# Patient Record
Sex: Male | Born: 1945 | Race: White | Hispanic: No | Marital: Married | Smoking: Never smoker
Health system: Southern US, Community
[De-identification: ages and names within clinical notes are randomized; demographics above are authoritative.]

## PROBLEM LIST (undated history)

## (undated) DIAGNOSIS — K219 Gastro-esophageal reflux disease without esophagitis: Secondary | ICD-10-CM

## (undated) DIAGNOSIS — C170 Malignant neoplasm of duodenum: Secondary | ICD-10-CM

## (undated) DIAGNOSIS — K922 Gastrointestinal hemorrhage, unspecified: Secondary | ICD-10-CM

## (undated) DIAGNOSIS — I10 Essential (primary) hypertension: Secondary | ICD-10-CM

## (undated) DIAGNOSIS — E119 Type 2 diabetes mellitus without complications: Secondary | ICD-10-CM

## (undated) DIAGNOSIS — H3530 Unspecified macular degeneration: Secondary | ICD-10-CM

## (undated) DIAGNOSIS — E785 Hyperlipidemia, unspecified: Secondary | ICD-10-CM

## (undated) DIAGNOSIS — N200 Calculus of kidney: Secondary | ICD-10-CM

## (undated) DIAGNOSIS — G4730 Sleep apnea, unspecified: Secondary | ICD-10-CM

## (undated) DIAGNOSIS — J869 Pyothorax without fistula: Secondary | ICD-10-CM

## (undated) DIAGNOSIS — C229 Malignant neoplasm of liver, not specified as primary or secondary: Secondary | ICD-10-CM

## (undated) DIAGNOSIS — I2699 Other pulmonary embolism without acute cor pulmonale: Secondary | ICD-10-CM

---

## 2002-03-30 ENCOUNTER — Emergency Department (HOSPITAL_COMMUNITY): Admission: EM | Admit: 2002-03-30 | Discharge: 2002-03-30 | Payer: Self-pay | Admitting: Emergency Medicine

## 2004-05-08 ENCOUNTER — Emergency Department (HOSPITAL_COMMUNITY): Admission: EM | Admit: 2004-05-08 | Discharge: 2004-05-08 | Payer: Self-pay | Admitting: Emergency Medicine

## 2004-05-18 ENCOUNTER — Emergency Department (HOSPITAL_COMMUNITY): Admission: EM | Admit: 2004-05-18 | Discharge: 2004-05-18 | Payer: Self-pay | Admitting: Emergency Medicine

## 2004-05-28 ENCOUNTER — Emergency Department (HOSPITAL_COMMUNITY): Admission: EM | Admit: 2004-05-28 | Discharge: 2004-05-28 | Payer: Self-pay | Admitting: Emergency Medicine

## 2010-11-03 ENCOUNTER — Emergency Department (HOSPITAL_COMMUNITY)
Admission: EM | Admit: 2010-11-03 | Discharge: 2010-11-04 | Disposition: A | Discharge status: Home / Self Care | Payer: Worker's Compensation | Attending: Emergency Medicine | Admitting: Emergency Medicine

## 2010-11-03 DIAGNOSIS — Y990 Civilian activity done for income or pay: Secondary | ICD-10-CM | POA: Insufficient documentation

## 2010-11-03 DIAGNOSIS — S0010XA Contusion of unspecified eyelid and periocular area, initial encounter: Secondary | ICD-10-CM | POA: Insufficient documentation

## 2010-11-03 DIAGNOSIS — W2203XA Walked into furniture, initial encounter: Secondary | ICD-10-CM | POA: Insufficient documentation

## 2010-11-03 DIAGNOSIS — Y9269 Other specified industrial and construction area as the place of occurrence of the external cause: Secondary | ICD-10-CM | POA: Insufficient documentation

## 2010-11-12 ENCOUNTER — Emergency Department (HOSPITAL_COMMUNITY)
Admission: EM | Admit: 2010-11-12 | Discharge: 2010-11-12 | Disposition: A | Discharge status: Home / Self Care | Payer: Worker's Compensation | Attending: Emergency Medicine | Admitting: Emergency Medicine

## 2010-11-12 DIAGNOSIS — L0201 Cutaneous abscess of face: Secondary | ICD-10-CM | POA: Insufficient documentation

## 2010-11-12 DIAGNOSIS — L03211 Cellulitis of face: Secondary | ICD-10-CM | POA: Insufficient documentation

## 2012-10-26 ENCOUNTER — Ambulatory Visit (INDEPENDENT_AMBULATORY_CARE_PROVIDER_SITE_OTHER): Payer: Medicare Other | Admitting: Family Medicine

## 2012-10-26 VITALS — BP 132/84 | HR 94 | Temp 97.6°F | Resp 20 | Ht 66.25 in | Wt 189.4 lb

## 2012-10-26 DIAGNOSIS — E119 Type 2 diabetes mellitus without complications: Secondary | ICD-10-CM | POA: Insufficient documentation

## 2012-10-26 DIAGNOSIS — N4889 Other specified disorders of penis: Secondary | ICD-10-CM

## 2012-10-26 DIAGNOSIS — Z8639 Personal history of other endocrine, nutritional and metabolic disease: Secondary | ICD-10-CM

## 2012-10-26 DIAGNOSIS — IMO0001 Reserved for inherently not codable concepts without codable children: Secondary | ICD-10-CM

## 2012-10-26 DIAGNOSIS — Z862 Personal history of diseases of the blood and blood-forming organs and certain disorders involving the immune mechanism: Secondary | ICD-10-CM

## 2012-10-26 DIAGNOSIS — N489 Disorder of penis, unspecified: Secondary | ICD-10-CM

## 2012-10-26 DIAGNOSIS — L723 Sebaceous cyst: Secondary | ICD-10-CM

## 2012-10-26 LAB — POCT WET PREP WITH KOH
KOH Prep POC: POSITIVE
Trichomonas, UA: NEGATIVE

## 2012-10-26 LAB — POCT CBC
Granulocyte percent: 77.1 %G (ref 37–80)
MID (cbc): 0.5 (ref 0–0.9)
MPV: 9.2 fL (ref 0–99.8)
POC Granulocyte: 7.6 — AB (ref 2–6.9)
POC MID %: 5.3 %M (ref 0–12)
Platelet Count, POC: 179 10*3/uL (ref 142–424)
RBC: 4.87 M/uL (ref 4.69–6.13)

## 2012-10-26 LAB — GLUCOSE, POCT (MANUAL RESULT ENTRY): POC Glucose: 305 mg/dl — AB (ref 70–99)

## 2012-10-26 LAB — BASIC METABOLIC PANEL
CO2: 25 mEq/L (ref 19–32)
Calcium: 9.4 mg/dL (ref 8.4–10.5)
Chloride: 98 mEq/L (ref 96–112)
Sodium: 133 mEq/L — ABNORMAL LOW (ref 135–145)

## 2012-10-26 MED ORDER — DOXYCYCLINE HYCLATE 100 MG PO TABS: 100.0000 mg | ORAL_TABLET | Freq: Two times a day (BID) | ORAL | Status: DC

## 2012-10-26 MED ORDER — METFORMIN HCL 500 MG PO TABS: 500.0000 mg | ORAL_TABLET | Freq: Two times a day (BID) | ORAL | Status: DC

## 2012-10-26 MED ORDER — FLUCONAZOLE 150 MG PO TABS: 150.0000 mg | ORAL_TABLET | Freq: Once | ORAL | Status: DC

## 2012-10-26 NOTE — Progress Notes (Addendum)
[facility] [address] [place] [address] [phone]  Date:  10/26/2012   Name:  Manuel Barr   DOB:  [birth year]   MRN:  [id number]  PCP:  No PCP Per Patient    Chief Complaint: Mass   History of Present Illness:  Manuel Barr is a 67 y.o. very pleasant male patient who presents with the following:  He notes a "lump" on the back of his neck- first noted a week ago.  It was sore- this is now resolved, but it seems to be getting larger.  No drainage yet.    6- 8 months ago he was diagnosed with an "ingrown hair" in the groin area. He was treated with abx and recovered.  He has this sort of problem again since about 5 days ago.  It is sore but not terrible.  No drainage.  Subjective "hot and cold" at home but no noted fever.   He is able to urinate, and can retract his foreskin without difficulty.    He has a history of DM,but does not take any medication for this.  He has been on several meds, but stopped taking them due to concern about possible adverse effects.  He has been on insulin and metformin in the past.  No current PCP  Last rx doxy last summer per his drug store- he is not sure if this was rx for his "ingrown hair."    There are no active problems to display for this patient.   Past Medical History  Diagnosis Date  . Diabetes mellitus without complication     History reviewed. No pertinent past surgical history.  History  Substance Use Topics  . Smoking status: Never Smoker   . Smokeless tobacco: Not on file  . Alcohol Use: No    Family History  Problem Relation Age of Onset  . Diabetes Mother   . Diabetes Father   . Heart disease Father   . Emphysema Father     Allergies  Allergen Reactions  . Codeine     Medication list has been reviewed and updated.  No current outpatient prescriptions on file prior to visit.   No current facility-administered medications on file prior to visit.    Review of Systems:  As  per HPI- otherwise negative.   Physical Examination: Filed Vitals:   10/26/12 1205  BP: 132/84  Pulse: 94  Temp: 97.6 F (36.4 C)  Resp: 20   Filed Vitals:   10/26/12 1205  Height: 5' 6.25" (1.683 m)  Weight: 189 lb 6.4 oz (85.911 kg)   Body mass index is 30.33 kg/(m^2). Ideal Body Weight: Weight in (lb) to have BMI = 25: 155.7  GEN: WDWN, NAD, Non-toxic, A & O x 3 HEENT: Atraumatic, Normocephalic. Neck supple. No masses, No LAD.  Bilateral TM wnl, oropharynx normal.  PEERL,EOMI.   On the back of his neck is a small sebaceous cyst.  Squeezed gently and removed material.  Does not appear acutely infected  Ears and Nose: No external deformity. CV: RRR, No M/G/R. No JVD. No thrill. No extra heart sounds. PULM: CTA B, no wheezes, crackles, rhonchi. No retractions. No resp. distress. No accessory muscle use. ABD: S, NT, ND, +BS. No rebound. No HSM. EXTR: No c/c/e NEURO Normal gait.  PSYCH: Normally interactive. Conversant. Not depressed or anxious appearing.  Calm demeanor.  GU: he has a red rash suggestive of yeast in his inguinal folds bilaterally.  The foreskin  is red, somewhat swollen and there is discharge from the skin.  I could not easily retract foreskin, but per pt he is able to do this without difficulty.    Results for orders placed in visit on 10/26/12  POCT CBC      Result Value Range   WBC 9.8  4.6 - 10.2 K/uL   Lymph, poc 1.7  0.6 - 3.4   POC LYMPH PERCENT 14.6  10 - 50 %L   MID (cbc) 0.5  0 - 0.9   POC MID % 5.3  0 - 12 %M   POC Granulocyte 7.6 (*) 2 - 6.9   Granulocyte percent 77.1  37 - 80 %G   RBC 4.87  4.69 - 6.13 M/uL   Hemoglobin 14.4  14.1 - 18.1 g/dL   HCT, POC 29.5  62.1 - 53.7 %   MCV 91.8  80 - 97 fL   MCH, POC 29.6  27 - 31.2 pg   MCHC 32.2  31.8 - 35.4 g/dL   RDW, POC 30.8     Platelet Count, POC 179  142 - 424 K/uL   MPV 9.2  0 - 99.8 fL  POCT GLYCOSYLATED HEMOGLOBIN (HGB A1C)      Result Value Range   Hemoglobin A1C >14.0    GLUCOSE,  POCT (MANUAL RESULT ENTRY)      Result Value Range   POC Glucose 305 (*) 70 - 99 mg/dl  POCT WET PREP WITH KOH      Result Value Range   Trichomonas, UA Negative     Clue Cells Wet Prep HPF POC neg     Epithelial Wet Prep HPF POC 0-4     Yeast Wet Prep HPF POC neg     Bacteria Wet Prep HPF POC 1+     RBC Wet Prep HPF POC neg     WBC Wet Prep HPF POC 0-7     KOH Prep POC Positive       Assessment and Plan:  Penile pain - Plan: POCT CBC, POCT Wet Prep with KOH, doxycycline (VIBRA-TABS) 100 MG tablet, fluconazole (DIFLUCAN) 150 MG tablet  Sebaceous cyst - Plan: POCT CBC, doxycycline (VIBRA-TABS) 100 MG tablet  History of diabetes mellitus - Plan: POCT glycosylated hemoglobin (Hb A1C), POCT glucose (manual entry), Basic metabolic panel, metFORMIN (GLUCOPHAGE) 500 MG tablet  Type II or unspecified type diabetes mellitus without mention of complication, uncontrolled   Uncontrolled DM- this is longstanding. Will start on metformin.  Await labs. Suspect he will need insulin.  However, [facility] which will allow him to get insulin more cheaply  Sebaceous cyst. Declines I and D at this time.  Hot compresses, doxycycline Groin infection: suspect yeast and bacterial infection.  Doxycycline, diflucan.  Plan recheck tomorrow to check his progress.  If any worsening, not able to urinate, etc he will go to the ER or call me.    Signed Abbe Amsterdam, MD  addnd 8/19- received his BMP.      Chemistry    Results for orders placed in visit on 10/26/12  BASIC METABOLIC PANEL      Result Value Range   Sodium 133 (*) 135 - 145 mEq/L   Potassium 3.8  3.5 - 5.3 mEq/L   Chloride 98  96 - 112 mEq/L   CO2 25  19 - 32 mEq/L   Glucose, Bld 293 (*) 70 - 99 mg/dL   BUN 15  6 -  23 mg/dL   Creat 9.14  7.82 - 9.56 mg/dL   Calcium 9.4  8.4 - 21.3 mg/dL  POCT CBC      Result Value Range   WBC 9.8  4.6 - 10.2 K/uL   Lymph, poc 1.7  0.6 - 3.4   POC LYMPH  PERCENT 14.6  10 - 50 %L   MID (cbc) 0.5  0 - 0.9   POC MID % 5.3  0 - 12 %M   POC Granulocyte 7.6 (*) 2 - 6.9   Granulocyte percent 77.1  37 - 80 %G   RBC 4.87  4.69 - 6.13 M/uL   Hemoglobin 14.4  14.1 - 18.1 g/dL   HCT, POC 08.6  57.8 - 53.7 %   MCV 91.8  80 - 97 fL   MCH, POC 29.6  27 - 31.2 pg   MCHC 32.2  31.8 - 35.4 g/dL   RDW, POC 46.9     Platelet Count, POC 179  142 - 424 K/uL   MPV 9.2  0 - 99.8 fL  POCT GLYCOSYLATED HEMOGLOBIN (HGB A1C)      Result Value Range   Hemoglobin A1C >14.0    GLUCOSE, POCT (MANUAL RESULT ENTRY)      Result Value Range   POC Glucose 305 (*) 70 - 99 mg/dl  POCT WET PREP WITH KOH      Result Value Range   Trichomonas, UA Negative     Clue Cells Wet Prep HPF POC neg     Epithelial Wet Prep HPF POC 0-4     Yeast Wet Prep HPF POC neg     Bacteria Wet Prep HPF POC 1+     RBC Wet Prep HPF POC neg     WBC Wet Prep HPF POC 0-7     KOH Prep POC Positive      Corrected sodium = 136  Anion gap = 10, ok. Await recheck today

## 2012-10-26 NOTE — Patient Instructions (Addendum)
Start back on metformin for your diabetes.  Use the doxycycline antibiotic and diflucan (yeast medication) as directed.  Also buy an OTC yeast cream such as lotromin and apply to your groin  Please come and see me tomorrow for a recheck  PLEASE FAST TRACK FOR A RECHECK 8/19- [place]

## 2012-10-27 ENCOUNTER — Encounter: Payer: Self-pay | Admitting: Family Medicine

## 2013-12-02 ENCOUNTER — Telehealth: Payer: Self-pay | Admitting: *Deleted

## 2013-12-02 NOTE — Telephone Encounter (Signed)
Called left message with male to have patient call the office to follow up diabetes.

## 2014-08-10 HISTORY — PX: ABDOMINAL EXPLORATION SURGERY: SHX538

## 2014-08-10 HISTORY — PX: CHOLECYSTECTOMY OPEN: SUR202

## 2014-09-09 HISTORY — PX: BILE DUCT STENT PLACEMENT: SHX1227

## 2014-11-07 ENCOUNTER — Encounter (HOSPITAL_COMMUNITY): Payer: Self-pay | Admitting: *Deleted

## 2014-11-07 ENCOUNTER — Emergency Department (HOSPITAL_COMMUNITY): Payer: Medicare Other

## 2014-11-07 ENCOUNTER — Observation Stay (HOSPITAL_COMMUNITY)
Admission: EM | Admit: 2014-11-07 | Discharge: 2014-11-08 | Disposition: A | Discharge status: Home / Self Care | Payer: Medicare Other | Attending: Internal Medicine | Admitting: Internal Medicine

## 2014-11-07 DIAGNOSIS — R4701 Aphasia: Secondary | ICD-10-CM | POA: Insufficient documentation

## 2014-11-07 DIAGNOSIS — R471 Dysarthria and anarthria: Secondary | ICD-10-CM | POA: Insufficient documentation

## 2014-11-07 DIAGNOSIS — R479 Unspecified speech disturbances: Secondary | ICD-10-CM | POA: Diagnosis not present

## 2014-11-07 DIAGNOSIS — Z6830 Body mass index (BMI) 30.0-30.9, adult: Secondary | ICD-10-CM | POA: Diagnosis not present

## 2014-11-07 DIAGNOSIS — E785 Hyperlipidemia, unspecified: Secondary | ICD-10-CM | POA: Insufficient documentation

## 2014-11-07 DIAGNOSIS — C787 Secondary malignant neoplasm of liver and intrahepatic bile duct: Secondary | ICD-10-CM | POA: Insufficient documentation

## 2014-11-07 DIAGNOSIS — F809 Developmental disorder of speech and language, unspecified: Secondary | ICD-10-CM | POA: Diagnosis not present

## 2014-11-07 DIAGNOSIS — C170 Malignant neoplasm of duodenum: Secondary | ICD-10-CM | POA: Insufficient documentation

## 2014-11-07 DIAGNOSIS — I6523 Occlusion and stenosis of bilateral carotid arteries: Secondary | ICD-10-CM | POA: Insufficient documentation

## 2014-11-07 DIAGNOSIS — G459 Transient cerebral ischemic attack, unspecified: Secondary | ICD-10-CM

## 2014-11-07 DIAGNOSIS — F419 Anxiety disorder, unspecified: Secondary | ICD-10-CM | POA: Insufficient documentation

## 2014-11-07 DIAGNOSIS — I10 Essential (primary) hypertension: Secondary | ICD-10-CM | POA: Insufficient documentation

## 2014-11-07 DIAGNOSIS — R410 Disorientation, unspecified: Secondary | ICD-10-CM | POA: Insufficient documentation

## 2014-11-07 DIAGNOSIS — E069 Thyroiditis, unspecified: Secondary | ICD-10-CM | POA: Insufficient documentation

## 2014-11-07 DIAGNOSIS — R4781 Slurred speech: Secondary | ICD-10-CM | POA: Insufficient documentation

## 2014-11-07 DIAGNOSIS — Z79899 Other long term (current) drug therapy: Secondary | ICD-10-CM | POA: Diagnosis not present

## 2014-11-07 DIAGNOSIS — E11649 Type 2 diabetes mellitus with hypoglycemia without coma: Secondary | ICD-10-CM | POA: Diagnosis not present

## 2014-11-07 DIAGNOSIS — K5900 Constipation, unspecified: Secondary | ICD-10-CM | POA: Diagnosis not present

## 2014-11-07 DIAGNOSIS — E119 Type 2 diabetes mellitus without complications: Secondary | ICD-10-CM | POA: Diagnosis not present

## 2014-11-07 HISTORY — DX: Essential (primary) hypertension: I10

## 2014-11-07 HISTORY — DX: Unspecified macular degeneration: H35.30

## 2014-11-07 HISTORY — DX: Hyperlipidemia, unspecified: E78.5

## 2014-11-07 HISTORY — DX: Malignant neoplasm of liver, not specified as primary or secondary: C22.9

## 2014-11-07 HISTORY — DX: Type 2 diabetes mellitus without complications: E11.9

## 2014-11-07 HISTORY — DX: Malignant neoplasm of duodenum: C17.0

## 2014-11-07 HISTORY — DX: Sleep apnea, unspecified: G47.30

## 2014-11-07 HISTORY — DX: Calculus of kidney: N20.0

## 2014-11-07 HISTORY — DX: Gastro-esophageal reflux disease without esophagitis: K21.9

## 2014-11-07 LAB — I-STAT CHEM 8, ED
BUN: 13 mg/dL (ref 6–20)
CREATININE: 1 mg/dL (ref 0.61–1.24)
Calcium, Ion: 1.06 mmol/L — ABNORMAL LOW (ref 1.13–1.30)
Chloride: 104 mmol/L (ref 101–111)
Glucose, Bld: 86 mg/dL (ref 65–99)
HEMATOCRIT: 33 % — AB (ref 39.0–52.0)
HEMOGLOBIN: 11.2 g/dL — AB (ref 13.0–17.0)
POTASSIUM: 3.1 mmol/L — AB (ref 3.5–5.1)
Sodium: 142 mmol/L (ref 135–145)
TCO2: 22 mmol/L (ref 0–100)

## 2014-11-07 LAB — COMPREHENSIVE METABOLIC PANEL
ALBUMIN: 3 g/dL — AB (ref 3.5–5.0)
ALT: 25 U/L (ref 17–63)
AST: 35 U/L (ref 15–41)
Alkaline Phosphatase: 128 U/L — ABNORMAL HIGH (ref 38–126)
Anion gap: 11 (ref 5–15)
BUN: 12 mg/dL (ref 6–20)
CHLORIDE: 108 mmol/L (ref 101–111)
CO2: 24 mmol/L (ref 22–32)
CREATININE: 1.21 mg/dL (ref 0.61–1.24)
Calcium: 8.8 mg/dL — ABNORMAL LOW (ref 8.9–10.3)
GFR calc Af Amer: >60 mL/min (ref >60)
GFR calc non Af Amer: >60 mL/min (ref >60)
Glucose, Bld: 91 mg/dL (ref 65–99)
Potassium: 3.2 mmol/L — ABNORMAL LOW (ref 3.5–5.1)
SODIUM: 143 mmol/L (ref 135–145)
Total Bilirubin: 1.3 mg/dL — ABNORMAL HIGH (ref 0.3–1.2)
Total Protein: 6.2 g/dL — ABNORMAL LOW (ref 6.5–8.1)

## 2014-11-07 LAB — DIFFERENTIAL
BASOS PCT: 0 % (ref 0–1)
Basophils Absolute: 0 10*3/uL (ref 0.0–0.1)
Eosinophils Absolute: 0.1 10*3/uL (ref 0.0–0.7)
Eosinophils Relative: 2 % (ref 0–5)
Lymphocytes Relative: 40 % (ref 12–46)
Lymphs Abs: 2 10*3/uL (ref 0.7–4.0)
Monocytes Absolute: 0.5 10*3/uL (ref 0.1–1.0)
Monocytes Relative: 10 % (ref 3–12)
NEUTROS ABS: 2.4 10*3/uL (ref 1.7–7.7)
Neutrophils Relative %: 48 % (ref 43–77)

## 2014-11-07 LAB — CBC
HCT: 32 % — ABNORMAL LOW (ref 39.0–52.0)
Hemoglobin: 10.7 g/dL — ABNORMAL LOW (ref 13.0–17.0)
MCH: 28.2 pg (ref 26.0–34.0)
MCHC: 33.4 g/dL (ref 30.0–36.0)
MCV: 84.4 fL (ref 78.0–100.0)
PLATELETS: 176 10*3/uL (ref 150–400)
RBC: 3.79 MIL/uL — ABNORMAL LOW (ref 4.22–5.81)
RDW: 15.6 % — AB (ref 11.5–15.5)
WBC: 5.1 10*3/uL (ref 4.0–10.5)

## 2014-11-07 LAB — PROTIME-INR
INR: 1.1 (ref 0.00–1.49)
Prothrombin Time: 14.4 seconds (ref 11.6–15.2)

## 2014-11-07 LAB — I-STAT TROPONIN, ED: Troponin i, poc: 0.02 ng/mL (ref 0.00–0.08)

## 2014-11-07 LAB — APTT: APTT: 27 s (ref 24–37)

## 2014-11-07 LAB — CBG MONITORING, ED
GLUCOSE-CAPILLARY: 148 mg/dL — AB (ref 65–99)
Glucose-Capillary: 96 mg/dL (ref 65–99)

## 2014-11-07 MED ORDER — ASPIRIN EC 81 MG PO TBEC
81.0000 mg | DELAYED_RELEASE_TABLET | Freq: Every day | ORAL | Status: DC
  Administered 2014-11-08: 81 mg via ORAL
  Filled 2014-11-07: qty 1

## 2014-11-07 MED ORDER — ONDANSETRON HCL 4 MG PO TABS: 4.0000 mg | ORAL_TABLET | Freq: Four times a day (QID) | ORAL | Status: DC | PRN

## 2014-11-07 MED ORDER — POLYETHYLENE GLYCOL 3350 17 G PO PACK
17.0000 g | PACK | Freq: Two times a day (BID) | ORAL | Status: DC
  Administered 2014-11-08: 17 g via ORAL
  Filled 2014-11-07 (×2): qty 1

## 2014-11-07 MED ORDER — PANTOPRAZOLE SODIUM 40 MG PO TBEC
40.0000 mg | DELAYED_RELEASE_TABLET | Freq: Every day | ORAL | Status: DC
  Administered 2014-11-08: 40 mg via ORAL
  Filled 2014-11-07: qty 1

## 2014-11-07 MED ORDER — ENOXAPARIN SODIUM 40 MG/0.4ML ~~LOC~~ SOLN
40.0000 mg | SUBCUTANEOUS | Status: DC
  Administered 2014-11-07: 40 mg via SUBCUTANEOUS
  Filled 2014-11-07: qty 0.4

## 2014-11-07 MED ORDER — SODIUM CHLORIDE 0.9 % IJ SOLN
3.0000 mL | Freq: Two times a day (BID) | INTRAMUSCULAR | Status: DC
  Administered 2014-11-07: 3 mL via INTRAVENOUS

## 2014-11-07 MED ORDER — ONDANSETRON HCL 4 MG/2ML IJ SOLN: 4.0000 mg | Freq: Four times a day (QID) | INTRAMUSCULAR | Status: DC | PRN

## 2014-11-07 MED ORDER — DEXTROSE 50 % IV SOLN
25.0000 mL | Freq: Once | INTRAVENOUS | Status: AC
  Administered 2014-11-07: 25 mL via INTRAVENOUS

## 2014-11-07 MED ORDER — PRAVASTATIN SODIUM 40 MG PO TABS
40.0000 mg | ORAL_TABLET | Freq: Every day | ORAL | Status: DC
  Administered 2014-11-07: 40 mg via ORAL
  Filled 2014-11-07: qty 1

## 2014-11-07 MED ORDER — POTASSIUM CHLORIDE IN NACL 40-0.9 MEQ/L-% IV SOLN
INTRAVENOUS | Status: AC
  Administered 2014-11-07 – 2014-11-08 (×2): 125 mL/h via INTRAVENOUS
  Filled 2014-11-07 (×3): qty 1000

## 2014-11-07 MED ORDER — DEXTROSE 50 % IV SOLN
INTRAVENOUS | Status: AC
  Filled 2014-11-07: qty 50

## 2014-11-07 NOTE — Code Documentation (Addendum)
69 year old presents to ED as code stroke via [facility].  Wife states he was watching TV all was fine - he went to bedroom to take a nap - he immediately called out from the bedroom for her to help him make a phone call - he was unable to get his words out and seemed confused.  On presentation he is alert - cool and dry- pale - moves all extremities with good strength -unable to follow commands for the most - will occassionaly follow simple one step command - speech is word salad - does have some social speech -"ouch that hurts'  " I am fine" then will have speech with no comprehensible sounds.  CBG 96.  NIHHS 7 - speech related.  Has recent (June) hx of abd surgery - attempted whipple per wife -but unable to resect - so mostly exp lap - did have CBD stent placed.  Has new dx duodenal cancer with liver mets -had  recent first chemo treatment at [place] in [place].  Stat DWI done - no acute stroke per MD.  Wife reports and recent hospital stay at [facility] in [facility] shows patient with normal blood sugar range in mid 200's to 300 range.  Question if 8 is hypoglycemic for this patient - wife states she has not seen it that low - reports he has not been eating as well since cancer dx - 1/2 amp D50W given with some resolution of speech - recognizing wife with correct naming of some objects.  No acute stroke treatment per Dr. Erlinda Hong.  Handoff to [facility] RN.

## 2014-11-07 NOTE — ED Notes (Signed)
Pt taken to MRI with this RN, rapid response RN and Dr Erlinda Hong on monitor. Pt tolerated well.

## 2014-11-07 NOTE — Consult Note (Signed)
Referring Physician: code stroke    Chief Complaint: sudden onset language deficit  HPI: Manuel Barr is an 69 y.o. male with hx of HTN, DM, HLD, duodenal cancer with liver metastasis s/p bile duct drainage one month ago presented with acute onset language difficulty. Pt was watching TV with wife and appeared to be normal including speech. He went to room and calling his friend, but seems to have confusion with speed dial and then got frustrated and agitated, yelling and speech with word salad and pressure speech. EMS called and on arrival, seems pt has some dizziness and blurry vision as per report. BP 138/86 and glucose 99. No weakness, numbness or facial droop, no HA. On arrival, pt had pressured speech and did not answer questions appropriately, intermittently follows commands. However, with painful stimulation, he was able to come up with completely sentences which were also appropriate. Due to the inconsistency, limited MRI done and did not show acute stroke. Pt is going to be admitted for further management.  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 Surgery aborted and he was treated with chemo and radiation. One months ago, he received bile duct drainage procedure.   As per note from EMS, pt also has HTN, DM and HLD on lantus, metformin, pravastatin and lisinopril.   LSN: 16:45 11/07/14 tPA Given: No: presentation was not consistent with stroke  Past Medical History  Diagnosis Date  . Diabetes mellitus without complication     History reviewed. No pertinent past surgical history.  Family History  Problem Relation Age of Onset  . Diabetes Mother   . Diabetes Father   . Heart disease Father   . Emphysema Father    Social History:  reports that he has never smoked. He does not have any smokeless tobacco history on file.  He reports that he does not drink alcohol or use illicit drugs.  Allergies:  Allergies  Allergen Reactions  . Codeine     Medications: No current facility-administered medications on file prior to encounter.   Current Outpatient Prescriptions on File Prior to Encounter  Medication Sig Dispense Refill  . doxycycline (VIBRA-TABS) 100 MG tablet Take 1 tablet (100 mg total) by mouth 2 (two) times daily. 20 tablet 0  . fluconazole (DIFLUCAN) 150 MG tablet Take 1 tablet (150 mg total) by mouth once. Repeat in 3 days 2 tablet 0  . metFORMIN (GLUCOPHAGE) 500 MG tablet Take 1 tablet (500 mg total) by mouth 2 (two) times daily with a meal. Increase as directed. 120 tablet 3   ROS: Review of 14 systems performed with wife as pt is not able to provide further information. ROS negative except mentioned in the HPI.    Physical Examination:  Temp:  [98.6 F (37 C)] 98.6 F (37 C) (08/29 1808) Pulse Rate:  [90-95] 95 (08/29 1900) Resp:  [17-24] 17 (08/29 1900) BP: (136-153)/(84-108) 136/88 mmHg (08/29 1900) SpO2:  [99 %-100 %] 100 % (08/29 1900) Weight:  [186 lb (84.369 kg)] 186 lb (84.369 kg) (08/29 1757)  General - Well nourished, well developed, anxious with mild agitation.  Ophthalmologic - Fundi not visualized due to noncooperation.  Cardiovascular - Regular rate and rhythm with no murmur.  Mental Status -  Awake alert, pressured speech, word salad, however, able to get complete and appropriate sentences intermittently especially during pain stimulation.  Did not answer appropriately with orientation questions. Did not answer appropriately with naming or repetition. Intermittently follows simple commands.  Cranial Nerves II - XII - II - Visual field intact OU. III, IV, VI - Extraocular movements intact. V - Facial sensation intact bilaterally. VII - Facial movement intact bilaterally. VIII - Hearing & vestibular intact bilaterally. X - Palate elevates symmetrically. XI - Chin  turning & shoulder shrug intact bilaterally. XII - Tongue protrusion intact.  Motor Strength - The patient's strength was symmetrical in all extremities and pronator drift was absent.  Bulk was normal and fasciculations were absent.   Motor Tone - Muscle tone was assessed at the neck and appendages and was normal.  Reflexes - The patient's reflexes were 1+ in all extremities and he had no pathological reflexes.  Sensory - Light touch, temperature/pinprick were assessed with equal withdraw bilaterally to pain stimulation.    Coordination - not cooperative on exam.  Tremor was absent.  Gait and Station - The patient's transfers, posture, gait, station, and turns were observed as normal.   Results for orders placed or performed during the hospital encounter of 11/07/14 (from the past 48 hour(s))  Protime-INR     Status: None   Collection Time: 11/07/14  5:40 PM  Result Value Ref Range   Prothrombin Time 14.4 11.6 - 15.2 seconds   INR 1.10 0.00 - 1.49  APTT     Status: None   Collection Time: 11/07/14  5:40 PM  Result Value Ref Range   aPTT 27 24 - 37 seconds  CBC     Status: Abnormal   Collection Time: 11/07/14  5:40 PM  Result Value Ref Range   WBC 5.1 4.0 - 10.5 K/uL   RBC 3.79 (L) 4.22 - 5.81 MIL/uL   Hemoglobin 10.7 (L) 13.0 - 17.0 g/dL   HCT 32.0 (L) 39.0 - 52.0 %   MCV 84.4 78.0 - 100.0 fL   MCH 28.2 26.0 - 34.0 pg   MCHC 33.4 30.0 - 36.0 g/dL   RDW 15.6 (H) 11.5 - 15.5 %   Platelets 176 150 - 400 K/uL  Differential     Status: None   Collection Time: 11/07/14  5:40 PM  Result Value Ref Range   Neutrophils Relative % 48 43 - 77 %   Neutro Abs 2.4 1.7 - 7.7 K/uL   Lymphocytes Relative 40 12 - 46 %   Lymphs Abs 2.0 0.7 - 4.0 K/uL   Monocytes Relative 10 3 - 12 %   Monocytes Absolute 0.5 0.1 - 1.0 K/uL   Eosinophils Relative 2 0 - 5 %   Eosinophils Absolute 0.1 0.0 - 0.7 K/uL   Basophils Relative 0 0 - 1 %   Basophils Absolute 0.0 0.0 - 0.1 K/uL  I-stat troponin, ED  (not at [facility], [facility])     Status: None   Collection Time: 11/07/14  5:45 PM  Result Value Ref Range   Troponin i, poc 0.02 0.00 - 0.08 ng/mL   Comment 3            Comment: Due to the release kinetics of cTnI, a negative result within the first hours of the onset of symptoms does not rule out myocardial infarction with certainty. If myocardial infarction is still suspected, repeat the test at appropriate intervals.   I-Stat Chem 8, ED  (not at [facility], [facility])     Status: Abnormal   Collection Time: 11/07/14  5:47 PM  Result Value Ref Range  Sodium 142 135 - 145 mmol/L   Potassium 3.1 (L) 3.5 - 5.1 mmol/L   Chloride 104 101 - 111 mmol/L   BUN 13 6 - 20 mg/dL   Creatinine, Ser 1.00 0.61 - 1.24 mg/dL   Glucose, Bld 86 65 - 99 mg/dL   Calcium, Ion 1.06 (L) 1.13 - 1.30 mmol/L   TCO2 22 0 - 100 mmol/L   Hemoglobin 11.2 (L) 13.0 - 17.0 g/dL   HCT 33.0 (L) 39.0 - 52.0 %  CBG monitoring, ED     Status: None   Collection Time: 11/07/14  6:03 PM  Result Value Ref Range   Glucose-Capillary 96 65 - 99 mg/dL   Ct Head Wo Contrast  11/07/2014   IMPRESSION: No acute intracranial finding. Chronic cortical volume loss as above.    MRI brain limited DWI - no acute infarction noted as per radiology report via phone.   Assessment: 69 y.o. male with hx of HTN, DM, HLD, duodenal cancer with liver metastasis s/p bile duct drainage one month ago presented with acute onset language difficulty. However, examination showed medically pressured speech, word salad, near appropriate answers with intermittently complete and appropriate sentences pressure on pain stimulation. Due to inconsistent exam, limited MRI did not I was performed which did not show acute infarction. Patient symptoms could be due to panic attack, anxiety, vs. hypoglycemia (baseline 200s, on presentation 90s). Was given 25 mL of D50. Recommend to admit for further evaluation.  Stroke Risk Factors - diabetes mellitus, hyperlipidemia, hypertension  and matastasis cancer  Plan: 1. HgbA1c, fasting lipid panel 2. MRI, MRA were completed and pending formal reports 3. PT consult, OT consult, Speech consult 4. Echocardiogram 5. Carotid dopplers 6. Prophylactic therapy-Antiplatelet med: Aspirin - dose 81mg  7. Risk factor modification 8. Telemetry monitoring 9. Frequent neuro checks 10. Continue other home meds including statin.  I spent 60 minutes of neurocritical care time in the care of this patient.  Rosalin Hawking, MD PhD Stroke Neurology 11/07/2014 7:24 PM

## 2014-11-07 NOTE — H&P (Signed)
Date: 11/08/2014               Patient Name:  Manuel Barr MRN: [id number]  DOB: [birth year] Age / Sex: 69 y.o., male   PCP: Shiela Mayer, PA         Medical Service: Internal Medicine Teaching Service         Attending Physician: Dr. Bartholomew Crews, MD    First Contact: Dr. Liberty Handy Pager: [phone]  Second Contact: Dr. Duwaine Maxin Pager: [phone]       After Hours (After 5p/  First Contact Pager: [phone]  weekends / holidays): Second Contact Pager: [phone]   Chief Complaint: sudden onset of confusion, difficulty with speech  History of Present Illness: Mr. Manuel Barr is a 69 y.o. 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  Symptom onset was this afternoon around 16:45 when patient was asymptomatic, watching TV.   At this time, patient went to the bedroom to attempt a phone call and experienced difficulty doing so and wife states he was confused, babbling his words, shaky, and tremulous.  Also, reports feeling weak, fatigued but not more so on one-side versus another.  Wife states that she did not notice any focal weakness or facial droop.  Both patient and his wife deny any prior history of similar symptoms of confusion, weakness.  Patient reports that his blood sugars have recently been in the 200s with reading this morning being 230  He was also recently hospitalized this month at [facility] for DKA.  He took 25 units of Lantus and glucose was 91 on admission.  States he has had previous episodes of hypoglycemia where he described symptoms of feeling shaky, tremulous.  Patient denies any shortness of breath, chest pain, fever, neck stiffness, headache.  He does endorse some chills.  Of note, patient  recently started chemotherapy treatment with FOLFOX for his duodenal cancer with liver mets.  According to patients wife, he is supposed to receive treatment every 2 weeks with the first having occurred on August 19.  In the emergency department per neurology and rapid response notes, patient presented with the inability to follow most commands with occasionally being able to follow simple, one-step commands.  Also, noted that his speech was word salad.  He was given 1/2 amp of D50 with some resolution of speech noted.  According to patient and his wife, patient vomited at sometime in the emergency department and both noticed a great improvement of his speech and mental status at that time.  Also, in the ED patient had a CT head without contrast negative for acute intracranial findings.  MRI brain without contrast negative for acute infarction, intracranial hemorrhage, or mass lesion.    Meds: Current Facility-Administered Medications  Medication Dose Route Frequency Provider Last Rate Last Dose  . 0.9 % NaCl with KCl 40 mEq / L  infusion   Intravenous Continuous Corky Sox, MD 125 mL/hr at 11/07/14 2211 125 mL/hr at 11/07/14 2211  . aspirin EC tablet 81 mg  81 mg Oral Daily Corky Sox, MD      . enoxaparin (LOVENOX) injection 40 mg  40 mg Subcutaneous Q24H Corky Sox, MD   40 mg at 11/07/14 2213  . feeding supplement (ENSURE  ENLIVE) (ENSURE ENLIVE) liquid 237 mL  237 mL Oral BID BM Bartholomew Crews, MD      . ondansetron [facility]) tablet 4 mg  4 mg Oral Q6H PRN Corky Sox, MD       Or  . ondansetron [facility]) injection 4 mg  4 mg Intravenous Q6H PRN Corky Sox, MD      . pantoprazole (PROTONIX) EC tablet 40 mg  40 mg Oral Daily Corky Sox, MD      . polyethylene glycol [facility]) packet 17 g  17 g Oral BID Corky Sox, MD   17 g at 11/07/14 2213  . pravastatin (PRAVACHOL) tablet 40 mg  40 mg Oral QHS Corky Sox, MD   40 mg at 11/07/14 2213  . sodium chloride 0.9 %  injection 3 mL  3 mL Intravenous Q12H Corky Sox, MD   3 mL at 11/07/14 2200    Allergies: Allergies as of 11/07/2014 - Review Complete 11/07/2014  Allergen Reaction Noted  . Codeine  10/26/2012   Past Medical History  Diagnosis Date  . Type II diabetes mellitus   . Hyperlipemia     Archie Endo 11/07/2014  . Hypertension     Archie Endo 11/07/2014  . Kidney stones   . 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 Liver cancer dx'd 08/2014  . Macular degeneration, bilateral   . GERD (gastroesophageal reflux disease)   . Sleep apnea     "suppose to use a mask but I haven't in awhile" (11/07/2014)   Past Surgical History  Procedure Laterality Date  . Abdominal exploration surgery  08/2014    attempted whipple per wife -but unable to resect /notes 11/07/2014  . Bile duct stent placement  09/2014    Archie Endo 11/07/2014  . Cholecystectomy open  08/2014   Family History  Problem Relation Age of Onset  . Diabetes Mother   . Diabetes Father   . Heart disease Father   . Emphysema Father    Social History   Social History  . Marital Status: Married    Spouse Name: N/A  . Number of Children: N/A  . Years of Education: N/A   Occupational History  . Not on file.   Social History Main Topics  . Smoking status: Never Smoker   . Smokeless tobacco: Never Used  . Alcohol Use: No  . Drug Use: No  . Sexual Activity: Not Currently   Other Topics Concern  . Not on file   Social History Narrative    Review of Systems: Pertinent items are noted in HPI.  Physical Exam: Blood pressure 156/90, pulse 97, temperature 97.7 F (36.5 C), temperature source Oral, resp. rate 18, height 5\' 6"  (1.676 m), weight 186 lb (84.369 kg), SpO2 100 %.  Physical Exam  Constitutional: He is oriented to person, place, and time. He appears well-developed and well-nourished. No distress.  Able to speak in full sentences with good insight.  Some tangental thinking.  Did experience 2 episodes of  emesis while we were in the room with patient.  HENT:  Head: Normocephalic and atraumatic.  Eyes: Conjunctivae and EOM are normal. Pupils are equal, round, and reactive to light.  Neck: Normal range of motion.  Cardiovascular: Normal rate and regular rhythm.   Respiratory: Effort normal and breath sounds normal.  Right sided chemotherapy port with no erythema, evidence of infection  GI: Soft. He exhibits no distension. There is no tenderness.  Musculoskeletal: Normal range of motion. He exhibits no edema.  Neurological: He is alert and oriented to person, place, and time. Coordination normal.  Skin: He is not diaphoretic.  Psychiatric: He has a normal mood and affect. His behavior is normal. Thought content normal.     Lab results: Basic Metabolic Panel:  Recent Labs  11/07/14 1740 11/07/14 1747  NA 143 142  K 3.2* 3.1*  CL 108 104  CO2 24  --   GLUCOSE 91 86  BUN 12 13  CREATININE 1.21 1.00  CALCIUM 8.8*  --    Liver Function Tests:  Recent Labs  11/07/14 1740  AST 35  ALT 25  ALKPHOS 128*  BILITOT 1.3*  PROT 6.2*  ALBUMIN 3.0*   CBC:  Recent Labs  11/07/14 1740 11/07/14 1747  WBC 5.1  --   NEUTROABS 2.4  --   HGB 10.7* 11.2*  HCT 32.0* 33.0*  MCV 84.4  --   PLT 176  --    CBG:  Recent Labs  11/07/14 1803 11/07/14 1905 11/08/14 0005  GLUCAP 96 148* 142*   Coagulation:  Recent Labs  11/07/14 1740  LABPROT 14.4  INR 1.10    Imaging results:  Ct Head Wo Contrast  11/07/2014   CLINICAL DATA:  Code stroke, unsteadiness, speech difficulty  EXAM: CT HEAD WITHOUT CONTRAST  TECHNIQUE: Contiguous axial images were obtained from the base of the skull through the vertex without intravenous contrast.  COMPARISON:  None.  FINDINGS: Diffuse cortical volume loss noted with proportional ventricular prominence. No acute hemorrhage, infarct, or mass lesion is identified. No midline shift. Orbits and paranasal sinuses are unremarkable. No skull fracture.   IMPRESSION: No acute intracranial finding. Chronic cortical volume loss as above.  Critical Value/emergent results were called by telephone at the time of interpretation on 11/07/2014 at 5:53 pm to Dr. Armida Sans , who verbally acknowledged these results.   Electronically Signed   By: Conchita Paris M.D.   On: 11/07/2014 17:56   Mr [name] Wo Contrast  11/07/2014   CLINICAL DATA:  69 year old diabetic with speech deficit Initial encounter.  EXAM: MRI HEAD WITHOUT CONTRAST  MRA HEAD WITHOUT CONTRAST  TECHNIQUE: Multiplanar, multiecho pulse sequences of the brain and surrounding structures were obtained without intravenous contrast. Angiographic images of the head were obtained using MRA technique without contrast.  COMPARISON:  11/07/2014 head CT.  No comparison brain MR.  FINDINGS: MRI HEAD FINDINGS  Exam is motion degraded.  No acute infarct.  No intracranial hemorrhage.  No intracranial mass lesion noted on this unenhanced exam.  Global atrophy without hydrocephalus.  Hyperostosis frontalis interna incidentally noted.  Cervical medullary junction, pituitary region, pineal region and orbital structures unremarkable.  MRA HEAD FINDINGS  Exam is motion degraded.  Right vertebral artery is occluded beyond the takeoff of the right posterior inferior cerebellar artery.  Left vertebral artery is dominant and ectatic with atherosclerotic type changes and mild narrowing.  Ectatic basilar artery with mild narrowing and irregularity. Prominent basilar tip without aneurysm.  Nonvisualized anterior inferior cerebellar arteries and left posterior inferior cerebellar artery.  Nonvisualized right superior cerebellar artery. Mild narrowing left superior cerebellar artery.  Mild narrowing posterior cerebral arteries more notable on the right.  Aplastic A1 segment left anterior cerebral artery. Ectatic A1 segment right anterior cerebral artery. Ectatic anterior communicating artery without saccular aneurysm.  Mild narrowing  irregularity M1 segment middle cerebral artery more notable on the left.  Mild middle cerebral artery branch vessel and irregularity  bilaterally.  Mild irregularity with minimal bulge cavernous segment internal carotid artery bilaterally. Findings felt to be related to atherosclerotic type changes rather than aneurysm.  Mild narrow supraclinoid segment internal carotid artery greater on left.  IMPRESSION: MRI HEAD FINDINGS  Exam is motion degraded.  No acute infarct.  No intracranial hemorrhage.  No intracranial mass lesion noted on this unenhanced exam.  Global atrophy without hydrocephalus.  MRA HEAD FINDINGS  Intracranial atherosclerotic type changes more notable involving the posterior circulation as detailed.   Electronically Signed   By: Genia Del M.D.   On: 11/07/2014 19:39   Mr Brain Wo Contrast  11/07/2014   CLINICAL DATA:  69 year old diabetic with speech deficit Initial encounter.  EXAM: MRI HEAD WITHOUT CONTRAST  MRA HEAD WITHOUT CONTRAST  TECHNIQUE: Multiplanar, multiecho pulse sequences of the brain and surrounding structures were obtained without intravenous contrast. Angiographic images of the head were obtained using MRA technique without contrast.  COMPARISON:  11/07/2014 head CT.  No comparison brain MR.  FINDINGS: MRI HEAD FINDINGS  Exam is motion degraded.  No acute infarct.  No intracranial hemorrhage.  No intracranial mass lesion noted on this unenhanced exam.  Global atrophy without hydrocephalus.  Hyperostosis frontalis interna incidentally noted.  Cervical medullary junction, pituitary region, pineal region and orbital structures unremarkable.  MRA HEAD FINDINGS  Exam is motion degraded.  Right vertebral artery is occluded beyond the takeoff of the right posterior inferior cerebellar artery.  Left vertebral artery is dominant and ectatic with atherosclerotic type changes and mild narrowing.  Ectatic basilar artery with mild narrowing and irregularity. Prominent basilar tip without  aneurysm.  Nonvisualized anterior inferior cerebellar arteries and left posterior inferior cerebellar artery.  Nonvisualized right superior cerebellar artery. Mild narrowing left superior cerebellar artery.  Mild narrowing posterior cerebral arteries more notable on the right.  Aplastic A1 segment left anterior cerebral artery. Ectatic A1 segment right anterior cerebral artery. Ectatic anterior communicating artery without saccular aneurysm.  Mild narrowing irregularity M1 segment middle cerebral artery more notable on the left.  Mild middle cerebral artery branch vessel and irregularity bilaterally.  Mild irregularity with minimal bulge cavernous segment internal carotid artery bilaterally. Findings felt to be related to atherosclerotic type changes rather than aneurysm.  Mild narrow supraclinoid segment internal carotid artery greater on left.  IMPRESSION: MRI HEAD FINDINGS  Exam is motion degraded.  No acute infarct.  No intracranial hemorrhage.  No intracranial mass lesion noted on this unenhanced exam.  Global atrophy without hydrocephalus.  MRA HEAD FINDINGS  Intracranial atherosclerotic type changes more notable involving the posterior circulation as detailed.   Electronically Signed   By: Genia Del M.D.   On: 11/07/2014 19:39    Assessment & Plan by Problem: Active Problems:   Dysarthria   Slurred speech  69 y.o. 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  Acute onset confusion with speech difficulty:  Patient with improving confusion, possible aphasia, word salad.  Suspect possible relative hypoglycemia with glucose in the 90s in a patient who usually runs in the 200s.  Most recent Hgb A1c of 8.4 on August 13 with recent admission  for DKA at [facility] two weeks ago.  Patient also states previous episodes of feeling shaky and tremulous with blood sugars in the 80s-90s.  Symptoms improved with 1/2 amp D50.  Patient also endorses recent decrease in PO intake 2/2 to chemotherapy.  Home medications for DM type II include metformin 1000mg  bid and Lantus 30 units qhs.  Also considering TIA given resolving symptoms of confusion, aphasia with negative CT head and MRI brain.  Other considerations include adverse reaction to chemotherapy although less likely given time between last dose of treatment.  No electrolyte abnormalities to suggest confusion or altered state.  No evidence of infection and port site does not look infected. -per neurology consult note, will obtain ECHO, lipid panel, carotid dopplers, speech evaluation -last Hgb A1c of 8.4 on August 13 -neuro checks q4h -PT/OT evaluation -CBG monitoring q4h -UDS pending -CMP, CBC, EKG in the morning -Aspirin 81mg  daily  Hypertension -continue to monitor -hold lisinopril   Hyperlipidemia -lipid panel pending -continue pravastatin 40mg  daily  Diabetes mellitus type II -home meds of metformin 1000mg  bid and Lantus 30Units qhs.  Per discharge summary note from [facility], may need to clarify if these are the doses he has been following.  Patient reports he did take 25 units Lantus this morning after glucose of 230 -? Whether hypoglycemia contributed to acute onset of confusion, tremulousness, and aphasia this afternoon -CBG q4h -if glucose becomes elevated can add on AC/HS SSI coverage  Duodenal cancer with liver mets -patient is s/p bile duct drainage with biliary stenting recently started on FOLFOX every 2 weeks on 8/19.  Constipation -Miralax bid  Diet: Heart healthy  DVT PPx: Lovenox  Code: full code  Dispo: Disposition is deferred at this time, awaiting improvement of current medical problems. Anticipated discharge in approximately 1-2 day(s).   The patient  does have a current PCP Levada [place], [place]) and does not need an [facility] hospital follow-up appointment after discharge.  The patient does not have transportation limitations that hinder transportation to clinic appointments.  Signed: Jule Ser, DO 11/08/2014, 1:27 AM

## 2014-11-08 ENCOUNTER — Observation Stay (HOSPITAL_COMMUNITY): Payer: Medicare Other

## 2014-11-08 ENCOUNTER — Other Ambulatory Visit (HOSPITAL_COMMUNITY): Payer: Self-pay

## 2014-11-08 ENCOUNTER — Observation Stay (HOSPITAL_BASED_OUTPATIENT_CLINIC_OR_DEPARTMENT_OTHER): Payer: Medicare Other

## 2014-11-08 DIAGNOSIS — Z79899 Other long term (current) drug therapy: Secondary | ICD-10-CM | POA: Diagnosis not present

## 2014-11-08 DIAGNOSIS — R471 Dysarthria and anarthria: Secondary | ICD-10-CM

## 2014-11-08 DIAGNOSIS — G459 Transient cerebral ischemic attack, unspecified: Secondary | ICD-10-CM

## 2014-11-08 DIAGNOSIS — C170 Malignant neoplasm of duodenum: Secondary | ICD-10-CM | POA: Diagnosis present

## 2014-11-08 DIAGNOSIS — C787 Secondary malignant neoplasm of liver and intrahepatic bile duct: Secondary | ICD-10-CM | POA: Diagnosis not present

## 2014-11-08 DIAGNOSIS — E785 Hyperlipidemia, unspecified: Secondary | ICD-10-CM | POA: Diagnosis present

## 2014-11-08 DIAGNOSIS — E11649 Type 2 diabetes mellitus with hypoglycemia without coma: Secondary | ICD-10-CM

## 2014-11-08 DIAGNOSIS — I10 Essential (primary) hypertension: Secondary | ICD-10-CM | POA: Diagnosis present

## 2014-11-08 DIAGNOSIS — Z9889 Other specified postprocedural states: Secondary | ICD-10-CM

## 2014-11-08 DIAGNOSIS — Z794 Long term (current) use of insulin: Secondary | ICD-10-CM

## 2014-11-08 DIAGNOSIS — E1159 Type 2 diabetes mellitus with other circulatory complications: Secondary | ICD-10-CM

## 2014-11-08 DIAGNOSIS — K5900 Constipation, unspecified: Secondary | ICD-10-CM

## 2014-11-08 LAB — COMPREHENSIVE METABOLIC PANEL
ALBUMIN: 2.3 g/dL — AB (ref 3.5–5.0)
ALK PHOS: 106 U/L (ref 38–126)
ALT: 21 U/L (ref 17–63)
AST: 21 U/L (ref 15–41)
Anion gap: 5 (ref 5–15)
BUN: 9 mg/dL (ref 6–20)
CALCIUM: 7.7 mg/dL — AB (ref 8.9–10.3)
CHLORIDE: 113 mmol/L — AB (ref 101–111)
CO2: 23 mmol/L (ref 22–32)
Creatinine, Ser: 1.02 mg/dL (ref 0.61–1.24)
GFR calc non Af Amer: >60 mL/min (ref >60)
GLUCOSE: 113 mg/dL — AB (ref 65–99)
Potassium: 4.1 mmol/L (ref 3.5–5.1)
SODIUM: 141 mmol/L (ref 135–145)
Total Bilirubin: 1.1 mg/dL (ref 0.3–1.2)
Total Protein: 4.9 g/dL — ABNORMAL LOW (ref 6.5–8.1)

## 2014-11-08 LAB — LIPID PANEL
CHOLESTEROL: 143 mg/dL (ref 0–200)
HDL: 17 mg/dL — AB (ref >40)
LDL Cholesterol: 92 mg/dL (ref 0–99)
TRIGLYCERIDES: 170 mg/dL — AB (ref <150)
Total CHOL/HDL Ratio: 8.4 RATIO
VLDL: 34 mg/dL (ref 0–40)

## 2014-11-08 LAB — CBC
HCT: 27.6 % — ABNORMAL LOW (ref 39.0–52.0)
Hemoglobin: 9.2 g/dL — ABNORMAL LOW (ref 13.0–17.0)
MCH: 28.5 pg (ref 26.0–34.0)
MCHC: 33.3 g/dL (ref 30.0–36.0)
MCV: 85.4 fL (ref 78.0–100.0)
Platelets: 156 10*3/uL (ref 150–400)
RBC: 3.23 MIL/uL — AB (ref 4.22–5.81)
RDW: 16.2 % — ABNORMAL HIGH (ref 11.5–15.5)
WBC: 3.9 10*3/uL — ABNORMAL LOW (ref 4.0–10.5)

## 2014-11-08 LAB — RAPID URINE DRUG SCREEN, HOSP PERFORMED
AMPHETAMINES: NOT DETECTED
Barbiturates: NOT DETECTED
Benzodiazepines: NOT DETECTED
Cocaine: NOT DETECTED
OPIATES: NOT DETECTED
Tetrahydrocannabinol: NOT DETECTED

## 2014-11-08 LAB — GLUCOSE, CAPILLARY
GLUCOSE-CAPILLARY: 118 mg/dL — AB (ref 65–99)
GLUCOSE-CAPILLARY: 108 mg/dL — AB (ref 65–99)
GLUCOSE-CAPILLARY: 127 mg/dL — AB (ref 65–99)
Glucose-Capillary: 142 mg/dL — ABNORMAL HIGH (ref 65–99)
Glucose-Capillary: 169 mg/dL — ABNORMAL HIGH (ref 65–99)

## 2014-11-08 MED ORDER — ENSURE ENLIVE PO LIQD: 237.0000 mL | Freq: Two times a day (BID) | ORAL | Status: DC

## 2014-11-08 MED ORDER — GLUCERNA SHAKE PO LIQD
237.0000 mL | Freq: Two times a day (BID) | ORAL | Status: DC
  Administered 2014-11-08: 237 mL via ORAL

## 2014-11-08 MED ORDER — ASPIRIN 81 MG PO TBEC: 81.0000 mg | DELAYED_RELEASE_TABLET | Freq: Every day | ORAL | Status: DC

## 2014-11-08 NOTE — Progress Notes (Signed)
*  PRELIMINARY RESULTS* Vascular Ultrasound Carotid Duplex (Doppler) has been completed.  Preliminary findings: Bilateral:  1-39% ICA stenosis.  Vertebral artery flow is antegrade.      Landry Mellow, RDMS, RVT  11/08/2014, 10:04 AM

## 2014-11-08 NOTE — Progress Notes (Signed)
Echocardiogram 2D Echocardiogram has been performed.  Jennette Dubin 11/08/2014, 3:09 PM

## 2014-11-08 NOTE — Consult Note (Deleted)
Subjective: No complianst today. No deficits, and dysathria has resolved, he is back to his baseline. No extremity weakness.   Objective: Vital signs in last 24 hours: Filed Vitals:   11/07/14 2105 11/07/14 2105 11/08/14 0524 11/08/14 1243  BP: 156/90 156/90 109/72 114/64  Pulse: 101 97 87 75  Temp: 97.7 F (36.5 C) 97.7 F (36.5 C) 99.2 F (37.3 C) 97.7 F (36.5 C)  TempSrc: Oral Oral Oral Oral  Resp: 18 18 18 20   Height:      Weight:      SpO2: 100% 100% 99% 99%   Weight change:   Intake/Output Summary (Last 24 hours) at 11/08/14 1319 Last data filed at 11/08/14 0700  Gross per 24 hour  Intake    875 ml  Output    400 ml  Net    475 ml   General appearance: alert, cooperative, appears stated age and in no distress Head: Normocephalic, without obvious abnormality, atraumatic, moist oral mucosa Heart: regular rate and rhythm, S1, S2 normal, no murmur, click, rub or gallop Abdomen: soft, non-tender; bowel sounds normal; no masses,  no organomegaly Extremities: extremities normal, atraumatic, no cyanosis or edema Neurologic: Alert and oriented X 3, normal strength and tone. Normal symmetric reflexes. Normal coordination and gait  Lab Results: Basic Metabolic Panel:  Recent Labs Lab 11/07/14 1740 11/07/14 1747 11/08/14 0500  NA 143 142 141  K 3.2* 3.1* 4.1  CL 108 104 113*  CO2 24  --  23  GLUCOSE 91 86 113*  BUN 12 13 9   CREATININE 1.21 1.00 1.02  CALCIUM 8.8*  --  7.7*   Liver Function Tests:  Recent Labs Lab 11/07/14 1740 11/08/14 0500  AST 35 21  ALT 25 21  ALKPHOS 128* 106  BILITOT 1.3* 1.1  PROT 6.2* 4.9*  ALBUMIN 3.0* 2.3*   No results for input(s): LIPASE, AMYLASE in the last 168 hours. No results for input(s): AMMONIA in the last 168 hours. CBC:  Recent Labs Lab 11/07/14 1740 11/07/14 1747 11/08/14 0500  WBC 5.1  --  3.9*  NEUTROABS 2.4  --   --   HGB 10.7* 11.2* 9.2*  HCT 32.0* 33.0* 27.6*  MCV 84.4  --  85.4  PLT 176  --  156    CBG:  Recent Labs Lab 11/07/14 1803 11/07/14 1905 11/08/14 0005 11/08/14 0419 11/08/14 0734 11/08/14 1202  GLUCAP 96 148* 142* 118* 108* 127*   Fasting Lipid Panel:  Recent Labs Lab 11/08/14 0500  CHOL 143  HDL 17*  LDLCALC 92  TRIG 170*  CHOLHDL 8.4   Coagulation:  Recent Labs Lab 11/07/14 1740  LABPROT 14.4  INR 1.10   Urine Drug Screen: Drugs of Abuse     Component Value Date/Time   LABOPIA NONE DETECTED 11/08/2014 0023   COCAINSCRNUR NONE DETECTED 11/08/2014 0023   LABBENZ NONE DETECTED 11/08/2014 0023   AMPHETMU NONE DETECTED 11/08/2014 0023   THCU NONE DETECTED 11/08/2014 0023   LABBARB NONE DETECTED 11/08/2014 0023    Micro Results: No results found for this or any previous visit (from the past 240 hour(s)). Studies/Results: Ct Head Wo Contrast  11/07/2014   CLINICAL DATA:  Code stroke, unsteadiness, speech difficulty  EXAM: CT HEAD WITHOUT CONTRAST  TECHNIQUE: Contiguous axial images were obtained from the base of the skull through the vertex without intravenous contrast.  COMPARISON:  None.  FINDINGS: Diffuse cortical volume loss noted with proportional ventricular prominence. No acute hemorrhage, infarct, or mass lesion is  identified. No midline shift. Orbits and paranasal sinuses are unremarkable. No skull fracture.  IMPRESSION: No acute intracranial finding. Chronic cortical volume loss as above.  Critical Value/emergent results were called by telephone at the time of interpretation on 11/07/2014 at 5:53 pm to Dr. Armida Sans , who verbally acknowledged these results.   Electronically Signed   By: Conchita Paris M.D.   On: 11/07/2014 17:56   Mr [name] Wo Contrast  11/07/2014   CLINICAL DATA:  69 year old diabetic with speech deficit Initial encounter.  EXAM: MRI HEAD WITHOUT CONTRAST  MRA HEAD WITHOUT CONTRAST  TECHNIQUE: Multiplanar, multiecho pulse sequences of the brain and surrounding structures were obtained without intravenous contrast. Angiographic  images of the head were obtained using MRA technique without contrast.  COMPARISON:  11/07/2014 head CT.  No comparison brain MR.  FINDINGS: MRI HEAD FINDINGS  Exam is motion degraded.  No acute infarct.  No intracranial hemorrhage.  No intracranial mass lesion noted on this unenhanced exam.  Global atrophy without hydrocephalus.  Hyperostosis frontalis interna incidentally noted.  Cervical medullary junction, pituitary region, pineal region and orbital structures unremarkable.  MRA HEAD FINDINGS  Exam is motion degraded.  Right vertebral artery is occluded beyond the takeoff of the right posterior inferior cerebellar artery.  Left vertebral artery is dominant and ectatic with atherosclerotic type changes and mild narrowing.  Ectatic basilar artery with mild narrowing and irregularity. Prominent basilar tip without aneurysm.  Nonvisualized anterior inferior cerebellar arteries and left posterior inferior cerebellar artery.  Nonvisualized right superior cerebellar artery. Mild narrowing left superior cerebellar artery.  Mild narrowing posterior cerebral arteries more notable on the right.  Aplastic A1 segment left anterior cerebral artery. Ectatic A1 segment right anterior cerebral artery. Ectatic anterior communicating artery without saccular aneurysm.  Mild narrowing irregularity M1 segment middle cerebral artery more notable on the left.  Mild middle cerebral artery branch vessel and irregularity bilaterally.  Mild irregularity with minimal bulge cavernous segment internal carotid artery bilaterally. Findings felt to be related to atherosclerotic type changes rather than aneurysm.  Mild narrow supraclinoid segment internal carotid artery greater on left.  IMPRESSION: MRI HEAD FINDINGS  Exam is motion degraded.  No acute infarct.  No intracranial hemorrhage.  No intracranial mass lesion noted on this unenhanced exam.  Global atrophy without hydrocephalus.  MRA HEAD FINDINGS  Intracranial atherosclerotic type  changes more notable involving the posterior circulation as detailed.   Electronically Signed   By: Genia Del M.D.   On: 11/07/2014 19:39   Mr Brain Wo Contrast  11/07/2014   CLINICAL DATA:  69 year old diabetic with speech deficit Initial encounter.  EXAM: MRI HEAD WITHOUT CONTRAST  MRA HEAD WITHOUT CONTRAST  TECHNIQUE: Multiplanar, multiecho pulse sequences of the brain and surrounding structures were obtained without intravenous contrast. Angiographic images of the head were obtained using MRA technique without contrast.  COMPARISON:  11/07/2014 head CT.  No comparison brain MR.  FINDINGS: MRI HEAD FINDINGS  Exam is motion degraded.  No acute infarct.  No intracranial hemorrhage.  No intracranial mass lesion noted on this unenhanced exam.  Global atrophy without hydrocephalus.  Hyperostosis frontalis interna incidentally noted.  Cervical medullary junction, pituitary region, pineal region and orbital structures unremarkable.  MRA HEAD FINDINGS  Exam is motion degraded.  Right vertebral artery is occluded beyond the takeoff of the right posterior inferior cerebellar artery.  Left vertebral artery is dominant and ectatic with atherosclerotic type changes and mild narrowing.  Ectatic basilar artery with mild narrowing and irregularity.  Prominent basilar tip without aneurysm.  Nonvisualized anterior inferior cerebellar arteries and left posterior inferior cerebellar artery.  Nonvisualized right superior cerebellar artery. Mild narrowing left superior cerebellar artery.  Mild narrowing posterior cerebral arteries more notable on the right.  Aplastic A1 segment left anterior cerebral artery. Ectatic A1 segment right anterior cerebral artery. Ectatic anterior communicating artery without saccular aneurysm.  Mild narrowing irregularity M1 segment middle cerebral artery more notable on the left.  Mild middle cerebral artery branch vessel and irregularity bilaterally.  Mild irregularity with minimal bulge cavernous  segment internal carotid artery bilaterally. Findings felt to be related to atherosclerotic type changes rather than aneurysm.  Mild narrow supraclinoid segment internal carotid artery greater on left.  IMPRESSION: MRI HEAD FINDINGS  Exam is motion degraded.  No acute infarct.  No intracranial hemorrhage.  No intracranial mass lesion noted on this unenhanced exam.  Global atrophy without hydrocephalus.  MRA HEAD FINDINGS  Intracranial atherosclerotic type changes more notable involving the posterior circulation as detailed.   Electronically Signed   By: Genia Del M.D.   On: 11/07/2014 19:39   Medications: I have reviewed the patient's current medications. Scheduled Meds: . aspirin EC  81 mg Oral Daily  . enoxaparin (LOVENOX) injection  40 mg Subcutaneous Q24H  . feeding supplement (ENSURE ENLIVE)  237 mL Oral BID BM  . pantoprazole  40 mg Oral Daily  . polyethylene glycol  17 g Oral BID  . pravastatin  40 mg Oral QHS  . sodium chloride  3 mL Intravenous Q12H   Continuous Infusions:  PRN Meds:.ondansetron **OR** ondansetron (ZOFRAN) IV Assessment/Plan: Principal Problem:   Dysarthria Active Problems:   Diabetes mellitus, type 2   Duodenal cancer   HLD (hyperlipidemia)   HTN (hypertension)  Dysarthria- Considerations hypoglycemia Versus TIA. Previous episodes of hypoglycemia. Stroke work up still underway. Hypoglycemia likely due to Poor PO intake with vomiting induced by chemo also taking insulin without good PO intake. - Neuro recs appreciated. - Echo pending - Carotid US, done but results pending - Lipid panel- LDl - 92, on pravasatatin- 40mg  daily - Hgba1c- 8.4- August 13  Hypertension- holding Bp meds- lisinopril for permissive HTN. Bp- 114/64 - Will resume on discharge.  Hyperlipidemia- lipid panel as above. -continue pravastatin 40mg  daily  Diabetes mellitus type II- took 25u yesterday morning, and still with hypoglycemia symptoms. He has an appointment with his doctor  tomorrow. Blood sugars have been stable today since admission, on no meds.  - Will d/c home  Without insulin to see his Doc tomorrow  Duodenal cancer with liver mets -patient is s/p bile duct drainage with biliary stenting recently started on FOLFOX every 2 weeks on 8/19. - Cont Glucerna shakes - Also cont Zofram 8mg  TID, this was effective prior to yesterdays episode of vomiting.  Constipation -Miralax bid  Dispo: Disposition is deferred at this time, awaiting improvement of current medical problems.  Anticipated discharge in approximately today or tomorrow.   The patient does have a current PCP Levada Dy [place], [place]) and does need an [facility] hospital follow-up appointment after discharge.  The patient does not know have transportation limitations that hinder transportation to clinic appointments.  .Services Needed at time of discharge: Y = Yes, Blank = No PT:   OT:   RN:   Equipment:   Other:      Bethena Roys, MD 11/08/2014, 1:19 PM

## 2014-11-08 NOTE — Progress Notes (Signed)
Occupational Therapy Evaluation Patient Details Name: Manuel Barr MRN: [id number] DOB: Oct 02, 1945 Today's Date: 11/08/2014    History of Present Illness Mr. Manuel Barr is a 69 y.o. caucasian male with a past medical history of hypertension, hyperlipidemia, diabetes mellitus type II, and duodenal cancer with liver metastasis status post bile duct drainage with biliary stenting who presents to the emergency department with sudden onset confusion and difficulty with speech.   Clinical Impression   Patient presents to OT close to baseline function for ADLs. Will benefit from skilled OT to maximize independence and to facilitate safe discharge home with his wife. OT will follow.    Follow Up Recommendations  No OT follow up;Supervision/Assistance - 24 hour    Equipment Recommendations  Other (comment) (to be determined)    Recommendations for Other Services PT consult     Precautions / Restrictions Precautions Precautions: Fall Restrictions Weight Bearing Restrictions: No      Mobility Bed Mobility Overal bed mobility: Independent                Transfers Overall transfer level: Needs assistance Equipment used: None Transfers: Sit to/from Stand Sit to Stand: Min guard;Supervision              Balance                                            ADL Overall ADL's : Needs assistance/impaired Eating/Feeding: Set up;Sitting   Grooming: Wash/dry hands;Wash/dry face;Set up;Sitting   Upper Body Bathing: Set up;Sitting           Lower Body Dressing: Maximal assistance;Sitting/lateral leans (don socks only)   Toilet Transfer: Min guard;Ambulation;BSC   Toileting- Water quality scientist and Hygiene: Min guard;Sit to/from stand       Functional mobility during ADLs: Min guard (no assistive device used) General ADL Comments: Patient received in bed; wife at bedside. Reports he feels "mostly back to normal" but reports "queasiness."  Able to ambulate within room without an assistive device with min guard A-S. Returned to bed at end of session.      Vision     Perception     Praxis      Pertinent Vitals/Pain Pain Assessment: No/denies pain     Hand Dominance Right   Extremity/Trunk Assessment Upper Extremity Assessment Upper Extremity Assessment: Overall WFL for tasks assessed   Lower Extremity Assessment Lower Extremity Assessment: Defer to PT evaluation       Communication Communication Communication: No difficulties   Cognition Arousal/Alertness: Awake/alert Behavior During Therapy: WFL for tasks assessed/performed Overall Cognitive Status: Within Functional Limits for tasks assessed                     General Comments       Exercises       Shoulder Instructions      Home Living Family/patient expects to be discharged to:: Private residence Living Arrangements: Spouse/significant other Available Help at Discharge: Family;Available 24 hours/day Type of Home: House Home Access: Stairs to enter [employer] of Steps: 3 Entrance Stairs-Rails: Right Home Layout: One level     Bathroom Shower/Tub: Teacher, Manuel Barr years/pre: Standard     Home Equipment: Environmental consultant - 2 wheels          Prior Functioning/Environment Level of Independence: Independent        Comments: drives  OT Diagnosis: Generalized weakness   OT Problem List: Decreased strength;Decreased activity tolerance;Decreased knowledge of use of DME or AE   OT Treatment/Interventions: Self-care/ADL training;DME and/or AE instruction;Cognitive remediation/compensation;Patient/family education    OT Goals(Current goals can be found in the care plan section) Acute Rehab OT Goals Patient Stated Goal: to go home OT Goal Formulation: With patient Time For Goal Achievement: 11/22/14 Potential to Achieve Goals: Good  OT Frequency: Min 2X/week   Barriers to D/C:            Co-evaluation               End of Session    Activity Tolerance: Patient tolerated treatment well Patient left: in bed;with call bell/phone within reach;with family/visitor present   Time: 6861-6837 OT Time Calculation (min): 23 min Charges:  OT General Charges $OT Visit: 1 Procedure OT Evaluation $Initial OT Evaluation Tier I: 1 Procedure OT Treatments $Therapeutic Activity: 8-22 mins G-Codes: OT G-codes **NOT FOR INPATIENT CLASS** Functional Limitation: Self care Self Care Current Status (G9021): At least 20 percent but less than 40 percent impaired, limited or restricted Self Care Goal Status (J1552): At least 1 percent but less than 20 percent impaired, limited or restricted  Oletta Buehring A 11/08/2014, 9:32 AM

## 2014-11-08 NOTE — Progress Notes (Signed)
Subjective: No complianst today. No deficits, and dysathria has resolved, he is back to his baseline. No extremity weakness.   Objective: Vital signs in last 24 hours: Filed Vitals:   11/07/14 2105 11/07/14 2105 11/08/14 0524 11/08/14 1243  BP: 156/90 156/90 109/72 114/64  Pulse: 101 97 87 75  Temp: 97.7 F (36.5 C) 97.7 F (36.5 C) 99.2 F (37.3 C) 97.7 F (36.5 C)  TempSrc: Oral Oral Oral Oral  Resp: 18 18 18 20   Height:      Weight:      SpO2: 100% 100% 99% 99%   Weight change:   Intake/Output Summary (Last 24 hours) at 11/08/14 1319 Last data filed at 11/08/14 0700  Gross per 24 hour  Intake    875 ml  Output    400 ml  Net    475 ml   General appearance: alert, cooperative, appears stated age and in no distress Head: Normocephalic, without obvious abnormality, atraumatic, moist oral mucosa Heart: regular rate and rhythm, S1, S2 normal, no murmur, click, rub or gallop Abdomen: soft, non-tender; bowel sounds normal; no masses,  no organomegaly Extremities: extremities normal, atraumatic, no cyanosis or edema Neurologic: Alert and oriented X 3, normal strength and tone. Normal symmetric reflexes. Normal coordination and gait  Lab Results: Basic Metabolic Panel:  Recent Labs Lab 11/07/14 1740 11/07/14 1747 11/08/14 0500  NA 143 142 141  K 3.2* 3.1* 4.1  CL 108 104 113*  CO2 24  --  23  GLUCOSE 91 86 113*  BUN 12 13 9   CREATININE 1.21 1.00 1.02  CALCIUM 8.8*  --  7.7*   Liver Function Tests:  Recent Labs Lab 11/07/14 1740 11/08/14 0500  AST 35 21  ALT 25 21  ALKPHOS 128* 106  BILITOT 1.3* 1.1  PROT 6.2* 4.9*  ALBUMIN 3.0* 2.3*   No results for input(s): LIPASE, AMYLASE in the last 168 hours. No results for input(s): AMMONIA in the last 168 hours. CBC:  Recent Labs Lab 11/07/14 1740 11/07/14 1747 11/08/14 0500  WBC 5.1  --  3.9*  NEUTROABS 2.4  --   --   HGB 10.7* 11.2* 9.2*  HCT 32.0* 33.0* 27.6*  MCV 84.4  --  85.4  PLT 176  --  156    CBG:  Recent Labs Lab 11/07/14 1803 11/07/14 1905 11/08/14 0005 11/08/14 0419 11/08/14 0734 11/08/14 1202  GLUCAP 96 148* 142* 118* 108* 127*   Fasting Lipid Panel:  Recent Labs Lab 11/08/14 0500  CHOL 143  HDL 17*  LDLCALC 92  TRIG 170*  CHOLHDL 8.4   Coagulation:  Recent Labs Lab 11/07/14 1740  LABPROT 14.4  INR 1.10   Urine Drug Screen: Drugs of Abuse     Component Value Date/Time   LABOPIA NONE DETECTED 11/08/2014 0023   COCAINSCRNUR NONE DETECTED 11/08/2014 0023   LABBENZ NONE DETECTED 11/08/2014 0023   AMPHETMU NONE DETECTED 11/08/2014 0023   THCU NONE DETECTED 11/08/2014 0023   LABBARB NONE DETECTED 11/08/2014 0023    Micro Results: No results found for this or any previous visit (from the past 240 hour(s)). Studies/Results: Ct Head Wo Contrast  11/07/2014   CLINICAL DATA:  Code stroke, unsteadiness, speech difficulty  EXAM: CT HEAD WITHOUT CONTRAST  TECHNIQUE: Contiguous axial images were obtained from the base of the skull through the vertex without intravenous contrast.  COMPARISON:  None.  FINDINGS: Diffuse cortical volume loss noted with proportional ventricular prominence. No acute hemorrhage, infarct, or mass lesion is  identified. No midline shift. Orbits and paranasal sinuses are unremarkable. No skull fracture.  IMPRESSION: No acute intracranial finding. Chronic cortical volume loss as above.  Critical Value/emergent results were called by telephone at the time of interpretation on 11/07/2014 at 5:53 pm to Dr. Armida Sans , who verbally acknowledged these results.   Electronically Signed   By: Conchita Paris M.D.   On: 11/07/2014 17:56   Mr [name] Wo Contrast  11/07/2014   CLINICAL DATA:  69 year old diabetic with speech deficit Initial encounter.  EXAM: MRI HEAD WITHOUT CONTRAST  MRA HEAD WITHOUT CONTRAST  TECHNIQUE: Multiplanar, multiecho pulse sequences of the brain and surrounding structures were obtained without intravenous contrast. Angiographic  images of the head were obtained using MRA technique without contrast.  COMPARISON:  11/07/2014 head CT.  No comparison brain MR.  FINDINGS: MRI HEAD FINDINGS  Exam is motion degraded.  No acute infarct.  No intracranial hemorrhage.  No intracranial mass lesion noted on this unenhanced exam.  Global atrophy without hydrocephalus.  Hyperostosis frontalis interna incidentally noted.  Cervical medullary junction, pituitary region, pineal region and orbital structures unremarkable.  MRA HEAD FINDINGS  Exam is motion degraded.  Right vertebral artery is occluded beyond the takeoff of the right posterior inferior cerebellar artery.  Left vertebral artery is dominant and ectatic with atherosclerotic type changes and mild narrowing.  Ectatic basilar artery with mild narrowing and irregularity. Prominent basilar tip without aneurysm.  Nonvisualized anterior inferior cerebellar arteries and left posterior inferior cerebellar artery.  Nonvisualized right superior cerebellar artery. Mild narrowing left superior cerebellar artery.  Mild narrowing posterior cerebral arteries more notable on the right.  Aplastic A1 segment left anterior cerebral artery. Ectatic A1 segment right anterior cerebral artery. Ectatic anterior communicating artery without saccular aneurysm.  Mild narrowing irregularity M1 segment middle cerebral artery more notable on the left.  Mild middle cerebral artery branch vessel and irregularity bilaterally.  Mild irregularity with minimal bulge cavernous segment internal carotid artery bilaterally. Findings felt to be related to atherosclerotic type changes rather than aneurysm.  Mild narrow supraclinoid segment internal carotid artery greater on left.  IMPRESSION: MRI HEAD FINDINGS  Exam is motion degraded.  No acute infarct.  No intracranial hemorrhage.  No intracranial mass lesion noted on this unenhanced exam.  Global atrophy without hydrocephalus.  MRA HEAD FINDINGS  Intracranial atherosclerotic type  changes more notable involving the posterior circulation as detailed.   Electronically Signed   By: Genia Del M.D.   On: 11/07/2014 19:39   Mr Brain Wo Contrast  11/07/2014   CLINICAL DATA:  69 year old diabetic with speech deficit Initial encounter.  EXAM: MRI HEAD WITHOUT CONTRAST  MRA HEAD WITHOUT CONTRAST  TECHNIQUE: Multiplanar, multiecho pulse sequences of the brain and surrounding structures were obtained without intravenous contrast. Angiographic images of the head were obtained using MRA technique without contrast.  COMPARISON:  11/07/2014 head CT.  No comparison brain MR.  FINDINGS: MRI HEAD FINDINGS  Exam is motion degraded.  No acute infarct.  No intracranial hemorrhage.  No intracranial mass lesion noted on this unenhanced exam.  Global atrophy without hydrocephalus.  Hyperostosis frontalis interna incidentally noted.  Cervical medullary junction, pituitary region, pineal region and orbital structures unremarkable.  MRA HEAD FINDINGS  Exam is motion degraded.  Right vertebral artery is occluded beyond the takeoff of the right posterior inferior cerebellar artery.  Left vertebral artery is dominant and ectatic with atherosclerotic type changes and mild narrowing.  Ectatic basilar artery with mild narrowing and irregularity.  Prominent basilar tip without aneurysm.  Nonvisualized anterior inferior cerebellar arteries and left posterior inferior cerebellar artery.  Nonvisualized right superior cerebellar artery. Mild narrowing left superior cerebellar artery.  Mild narrowing posterior cerebral arteries more notable on the right.  Aplastic A1 segment left anterior cerebral artery. Ectatic A1 segment right anterior cerebral artery. Ectatic anterior communicating artery without saccular aneurysm.  Mild narrowing irregularity M1 segment middle cerebral artery more notable on the left.  Mild middle cerebral artery branch vessel and irregularity bilaterally.  Mild irregularity with minimal bulge cavernous  segment internal carotid artery bilaterally. Findings felt to be related to atherosclerotic type changes rather than aneurysm.  Mild narrow supraclinoid segment internal carotid artery greater on left.  IMPRESSION: MRI HEAD FINDINGS  Exam is motion degraded.  No acute infarct.  No intracranial hemorrhage.  No intracranial mass lesion noted on this unenhanced exam.  Global atrophy without hydrocephalus.  MRA HEAD FINDINGS  Intracranial atherosclerotic type changes more notable involving the posterior circulation as detailed.   Electronically Signed   By: Genia Del M.D.   On: 11/07/2014 19:39   Medications: I have reviewed the patient's current medications. Scheduled Meds: . aspirin EC  81 mg Oral Daily  . enoxaparin (LOVENOX) injection  40 mg Subcutaneous Q24H  . feeding supplement (ENSURE ENLIVE)  237 mL Oral BID BM  . pantoprazole  40 mg Oral Daily  . polyethylene glycol  17 g Oral BID  . pravastatin  40 mg Oral QHS  . sodium chloride  3 mL Intravenous Q12H   Continuous Infusions:  PRN Meds:.ondansetron **OR** ondansetron (ZOFRAN) IV Assessment/Plan: Principal Problem:   Dysarthria Active Problems:   Diabetes mellitus, type 2   Duodenal cancer   HLD (hyperlipidemia)   HTN (hypertension)  Dysarthria- Considerations hypoglycemia Versus TIA. Previous episodes of hypoglycemia. Stroke work up still underway. Hypoglycemia likely due to Poor PO intake with vomiting induced by chemo also taking insulin without good PO intake. - Neuro recs appreciated. - Echo pending - Carotid US, done but results pending - Lipid panel- LDl - 92, on pravasatatin- 40mg  daily - Hgba1c- 8.4- August 13  Hypertension- holding Bp meds- lisinopril for permissive HTN. Bp- 114/64 - Will resume on discharge.  Hyperlipidemia- lipid panel as above. -continue pravastatin 40mg  daily  Diabetes mellitus type II- took 25u yesterday morning, and still with hypoglycemia symptoms. He has an appointment with his doctor  tomorrow. Blood sugars have been stable today since admission, on no meds.  - Will d/c home  Without insulin to see his Doc tomorrow  Duodenal cancer with liver mets -patient is s/p bile duct drainage with biliary stenting recently started on FOLFOX every 2 weeks on 8/19. - Cont Glucerna shakes - Also cont Zofram 8mg  TID, this was effective prior to yesterdays episode of vomiting.  Constipation -Miralax bid  Dispo: Disposition is deferred at this time, awaiting improvement of current medical problems.  Anticipated discharge in approximately today or tomorrow.   The patient does have a current PCP Levada Dy [place], [place]) and does need an [facility] hospital follow-up appointment after discharge.  The patient does not know have transportation limitations that hinder transportation to clinic appointments.  .Services Needed at time of discharge: Y = Yes, Blank = No PT:   OT:   RN:   Equipment:   Other:      Bethena Roys, MD 11/08/2014, 1:19 PM

## 2014-11-08 NOTE — Progress Notes (Signed)
PT Cancellation Note  Patient Details Name: Manuel Barr MRN: [id number] DOB: Aug 30, 1945   Cancelled Treatment:    Reason Eval/Treat Not Completed: Patient at procedure or test/unavailable Currently having in-room procedure performed. Will check back for PT evaluation later today.  Ellouise Newer 11/08/2014, 2:18 PM Camille Bal [place], [place]

## 2014-11-08 NOTE — Progress Notes (Signed)
Physical Therapy Note  Tolerated dynamic gait tasks without loss of balance for >150 feet. Patient reports he has been independent in room, walking back and forth to restroom as needed. Wife confirms pt appears to be at his baseline with mobility. Patient is functioning at a high level of independence and no physical therapy is indicated at this time. PT is signing-off. Please re-order if there is any significant change in status. Thank you for this referral.  Camille Bal [place], [place]   11/08/2014 [phone]

## 2014-11-08 NOTE — Care Management Note (Signed)
Case Management Note  Patient Details  Name: MAYANK TEUSCHER MRN: [id number] Date of Birth: Jul 06, 1945  Subjective/Objective:    Patient lives with spouse, patient states he has [employer],  He has transportation at [employer],  [facility] [place] usually mails his medications to him, but if needed he can take scripts to any pharmacy at dc to get meds filled.  Patient has a pcp at [facility] [place].  Patient has a rolling walker at home but does not use it at all.  NCM will cont to follow for dc needs.                Action/Plan:   Expected Discharge Date:                  Expected Discharge Plan:  Home/Self Care  In-House Referral:     Discharge planning Services  CM Consult  Post Acute Care Choice:    Choice offered to:     DME Arranged:    DME Agency:     HH Arranged:    HH Agency:     Status of Service:  In process, will continue to follow  Medicare Important Message Given:    Date Medicare IM Given:    Medicare IM give by:    Date Additional Medicare IM Given:    Additional Medicare Important Message give by:     If discussed at [place] of Stay Meetings, dates discussed:    Additional Comments:  Zenon Mayo, RN 11/08/2014, 2:06 PM

## 2014-11-08 NOTE — Progress Notes (Signed)
Subjective: After admission and the administration of dextrose, the patient's mental status began to improve dramatically.  He ate a small portion of his breakfast this morning. He was taken down for carotid dopplers and EEG this AM. The patient had no complaints otherwise. He is feeling great and ready to go home when possible. He will be seeing his "diabetes doctor" tomorrow.  Objective: Vital signs in last 24 hours: Filed Vitals:   11/07/14 2015 11/07/14 2105 11/07/14 2105 11/08/14 0524  BP: 147/68 156/90 156/90 109/72  Pulse: 92 101 97 87  Temp:  97.7 F (36.5 C) 97.7 F (36.5 C) 99.2 F (37.3 C)  TempSrc:  Oral Oral Oral  Resp: 24 18 18 18   Height:      Weight:      SpO2: 100% 100% 100% 99%   Weight change:   Intake/Output Summary (Last 24 hours) at 11/08/14 1109 Last data filed at 11/08/14 0700  Gross per 24 hour  Intake    875 ml  Output    400 ml  Net    475 ml    Physical Exam Gen: Well appearing, NAD HEENT: MMM, EOMI, PEERLA, no LAD, no JVD CV: RRR, normal S1 S2, no murmurs, rubs, or gallops appreciated. Pulm: Clear to ascultation bilaterally, no wheezes or crackles. GI: +BS, nondistended, nontender to palpation, no hepatosplenomegaly Ext: Pulses 2+ bilaterally, warm to touch Neuro: Motor and sensory grossly normal Skin: Chemo port on right side. No erythema or drainage appreciated.  Lab Results: Labs reviewed in Epic  Imaging Studies/Results: Imaging reviewed in Epic  Medications:  Medication reviewed in Epic  Assessment/Plan:  70 y.o. 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  Acute onset confusion with speech difficulty: Patient with improving confusion, possible aphasia, word salad.  Believed to be associated with hypoglycemia. Pt states hes felt shaky and tremulous in the past with sugars in 80-90s. Improved with dextrose administration. Recent decrease in PO intake 2/2 chemo. Neuro workup (CT, MRI) negative. Electrolytes and CBC stable. - Stroke/TIA workup: LDL 92, HgbA1c 8.4 - f/u echo, EEG - prelim carotid doppler read: 1-39% ICA stenosis bilaterally -neuro checks q4h  -PT/OT evaluation -CBG monitoring q4h -UDS urnemarkable -Aspirin 81mg  daily  Hypertension -continue to monitor -hold lisinopril   Hyperlipidemia -LDL 92 -continue pravastatin 40mg  daily  Diabetes mellitus type II: home meds of metformin 1000mg  bid and Lantus 30Units qhs. However, he is not currently adhering to his medications. Currently not taking metformin bc oncologists mentioned not to take it once before chemo. He changes his lantus dose daily based on his blood sugars. He normally takes his lantus at night however took it the morning of hypoglycemic event because he forgot to take it the night before. He does not have any short acting insulin. -Patient is seeing his "diabetes doctor" tomorrow. At this time, we will not change his dose but have written our recommendations regarding starting short acting insulin with a non-adjustable basal lantus dose.  Duodenal cancer with liver mets -patient is s/p bile duct drainage with biliary stenting recently started on FOLFOX every 2 weeks on 8/19.  Constipation -Miralax bid  Diet: Heart healthy   DVT PPx: Lovenox  Code: full code  Dispo: Home pending Stroke/TIA workup  The patient does have a current PCP Levada Dy  [place], [place]) and does not need an [facility] hospital follow-up appointment after discharge.  This is a Careers information officer Note.  The care of the patient was discussed with Dr. Denton Brick and the assessment and plan formulated with their assistance.  Please see their attached note for official documentation of the daily encounter.    Rolin Barry,  Med Student 11/08/2014, 11:09 AM

## 2014-11-08 NOTE — Progress Notes (Signed)
NURSING PROGRESS NOTE  Manuel Barr [id number] Discharge Data: 11/08/2014 5:33 PM Attending Provider: Bartholomew Crews, MD GLO:VFIEP,PIRJJ, PA     [name] to be D/C'd Home per MD order.  Discussed with the patient the After Visit Summary and all questions fully answered. All IV's discontinued with no bleeding noted. All belongings returned to patient for patient to take home.   Last Vital Signs:  Blood pressure 114/64, pulse 75, temperature 97.7 F (36.5 C), temperature source Oral, resp. rate 20, height 5\' 6"  (1.676 m), weight 84.369 kg (186 lb), SpO2 99 %.  Discharge Medication List   Medication List    STOP taking these medications        LANTUS 100 UNIT/ML injection  Generic drug:  insulin glargine      TAKE these medications        aspirin 81 MG EC tablet  Take 1 tablet (81 mg total) by mouth daily.     doxycycline 100 MG tablet  Commonly known as:  VIBRA-TABS  Take 1 tablet (100 mg total) by mouth 2 (two) times daily.  Notes to Patient:  You completed this medication course. Do not need to take.      fluconazole 150 MG tablet  Commonly known as:  DIFLUCAN  Take 1 tablet (150 mg total) by mouth once. Repeat in 3 days     fluticasone 50 MCG/ACT nasal spray  Commonly known as:  FLONASE  Place 1 spray into both nostrils daily as needed for allergies or rhinitis.     lisinopril 5 MG tablet  Commonly known as:  PRINIVIL,ZESTRIL  Take 2.5 mg by mouth daily.     metFORMIN 500 MG 24 hr tablet  Commonly known as:  GLUCOPHAGE-XR  Take 1,000 mg by mouth 2 (two) times daily.     ondansetron 8 MG tablet  Commonly known as:  ZOFRAN  Take 8 mg by mouth every 8 (eight) hours as needed for nausea or vomiting.     pantoprazole 40 MG tablet  Commonly known as:  PROTONIX  Take 40 mg by mouth daily.     potassium chloride SA 20 MEQ tablet  Commonly known as:  K-DUR,KLOR-CON  Take 20 mEq by mouth 2 (two) times daily.     pravastatin 40 MG tablet  Commonly  known as:  PRAVACHOL  Take 40 mg by mouth at bedtime.         Charolette Child, RN

## 2014-11-08 NOTE — Procedures (Signed)
ELECTROENCEPHALOGRAM REPORT   Patient: Manuel Barr       Room #: 1X79 EEG No. ID: [id number] Age: 69 y.o.        Sex: male Referring Physician: Lynnae January Report Date:  11/08/2014        Interpreting Physician: Alexis Goodell  History: [name] is an 69 y.o. male with acute onset mental status changes  Medications:  Scheduled: . aspirin EC  81 mg Oral Daily  . enoxaparin (LOVENOX) injection  40 mg Subcutaneous Q24H  . feeding supplement (GLUCERNA SHAKE)  237 mL Oral BID BM  . pantoprazole  40 mg Oral Daily  . polyethylene glycol  17 g Oral BID  . pravastatin  40 mg Oral QHS  . sodium chloride  3 mL Intravenous Q12H    Conditions of Recording:  This is a 16 channel EEG carried out with the patient in the awake, drowsy and asleep states.  Description:  The waking background activity consists of a low voltage, symmetrical, fairly well organized, 9 Hz alpha activity, seen from the parieto-occipital and posterior temporal regions.  Low voltage fast activity, poorly organized, is seen anteriorly and is at times superimposed on more posterior regions.  A mixture of theta and alpha rhythms are seen from the central and temporal regions. The patient drowses with slowing to irregular, low voltage theta and beta activity.   The patient goes in to a light sleep with symmetrical sleep spindles, vertex central sharp transients and irregular slow activity.   Hyperventilation was not performed.   Intermittent photic stimulation was performed but failed to illicit any change in the tracing.    IMPRESSION: Normal electroencephalogram, awake, asleep and with activation procedures. There are no focal lateralizing or epileptiform features.   Alexis Goodell, MD [facility] [phone] 11/08/2014, 4:43 PM

## 2014-11-08 NOTE — Progress Notes (Signed)
STROKE TEAM PROGRESS NOTE   SUBJECTIVE (INTERVAL HISTORY) Wife is at bedside. Pt is back to baseline with normal speech. He did not know why his speech was like that last night. But agree to cope with stress at home and with cancer treatment.   OBJECTIVE Temp:  [97.7 F (36.5 C)-99.2 F (37.3 C)] 99.2 F (37.3 C) (08/30 0524) Pulse Rate:  [87-101] 87 (08/30 0524) Cardiac Rhythm:  [-] Normal sinus rhythm (08/30 0700) Resp:  [17-26] 18 (08/30 0524) BP: (109-156)/(68-108) 109/72 mmHg (08/30 0524) SpO2:  [99 %-100 %] 99 % (08/30 0524) Weight:  [84.369 kg (186 lb)] 84.369 kg (186 lb) (08/29 1757)  CBC:  Recent Labs Lab 11/07/14 1740 11/07/14 1747 11/08/14 0500  WBC 5.1  --  3.9*  NEUTROABS 2.4  --   --   HGB 10.7* 11.2* 9.2*  HCT 32.0* 33.0* 27.6*  MCV 84.4  --  85.4  PLT 176  --  993   Basic Metabolic Panel:  Recent Labs Lab 11/07/14 1740 11/07/14 1747 11/08/14 0500  NA 143 142 141  K 3.2* 3.1* 4.1  CL 108 104 113*  CO2 24  --  23  GLUCOSE 91 86 113*  BUN 12 13 9   CREATININE 1.21 1.00 1.02  CALCIUM 8.8*  --  7.7*   Lipid Panel:    Component Value Date/Time   CHOL 143 11/08/2014 0500   TRIG 170* 11/08/2014 0500   HDL 17* 11/08/2014 0500   CHOLHDL 8.4 11/08/2014 0500   VLDL 34 11/08/2014 0500   LDLCALC 92 11/08/2014 0500   HgbA1c:  Lab Results  Component Value Date   HGBA1C >14.0 10/26/2012   Urine Drug Screen:    Component Value Date/Time   LABOPIA NONE DETECTED 11/08/2014 0023   COCAINSCRNUR NONE DETECTED 11/08/2014 0023   LABBENZ NONE DETECTED 11/08/2014 0023   AMPHETMU NONE DETECTED 11/08/2014 0023   THCU NONE DETECTED 11/08/2014 0023   LABBARB NONE DETECTED 11/08/2014 0023      IMAGING  Ct Head Wo Contrast 11/07/2014   No acute intracranial finding. Chronic cortical volume loss   MRI HEAD 11/08/2014 Exam is motion degraded.  No acute infarct.  No intracranial hemorrhage.  No intracranial mass lesion noted on this unenhanced exam.  Global  atrophy without hydrocephalus.    MRA HEAD  11/08/2014 Intracranial atherosclerotic type changes more notable involving the posterior circulation as detailed.     Carotid Doppler  There is 1-39% bilateral ICA stenosis. Vertebral artery flow is antegrade.     PHYSICAL EXAM  Temp:  [97.7 F (36.5 C)-99.2 F (37.3 C)] 97.7 F (36.5 C) (08/30 1243) Pulse Rate:  [75-87] 75 (08/30 1243) Resp:  [18-20] 20 (08/30 1243) BP: (109-114)/(64-72) 114/64 mmHg (08/30 1243) SpO2:  [99 %] 99 % (08/30 1243)  General - Well nourished, well developed, in no apparent distress.  Ophthalmologic - Fundi not visualized due to small pupils.  Cardiovascular - Regular rate and rhythm with no murmur.  Mental Status -  Level of arousal and orientation to time, place, and person were intact. Language including expression, naming, repetition, comprehension was assessed and found intact. Fund of Knowledge was assessed and was intact.  Cranial Nerves II - XII - II - Visual field intact OU. III, IV, VI - Extraocular movements intact. V - Facial sensation intact bilaterally. VII - Facial movement intact bilaterally. VIII - Hearing & vestibular intact bilaterally. X - Palate elevates symmetrically. XI - Chin turning & shoulder shrug intact bilaterally. XII -  Tongue protrusion intact.  Motor Strength - The patient's strength was normal in all extremities and pronator drift was absent.  Bulk was normal and fasciculations were absent.   Motor Tone - Muscle tone was assessed at the neck and appendages and was normal.  Reflexes - The patient's reflexes were 1+ in all extremities and he had no pathological reflexes.  Sensory - Light touch, temperature/pinprick, vibration and proprioception, and Romberg testing were assessed and were symmetrical.    Coordination - The patient had normal movements in the hands and feet with no ataxia or dysmetria.  Tremor was absent.  Gait and Station - The patient's transfers,  posture, gait, station, and turns were observed as normal.   ASSESSMENT/PLAN Mr. HRISTOPHER MISSILDINE is a 69 y.o. 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 He did not receive IV t-PA as presentation was not consistent with stroke.   Pressured speech - most likely due to anxiety and stress, DDx including TIA, seizure, encephalopathy, chemo meds adverse effects   MRI  No acute stroke  MRA  Posterior circulation atherosclerosis  Carotid Doppler  No significant stenosis   2D Echo  EF 55-60%  EEG - normal  LDL 92  HgbA1c not performed. See PCP tomorrow to check A1C   Lovenox 40 mg sq daily for VTE prophylaxis  ASA 81mg  at home prior to admission, now on ASA 81mg .   Therapy recommendations:  No need  Disposition:  Return home  Hypertension  Stable  Home meds - lisinopril   Hyperlipidemia  Home meds:  pravachol 40, resumed in hospital  LDL 92, goal < 70  Continue statin at discharge  Diabetes  HgbA1c not performed, goal < 7.0  Uncontrolled  Other Stroke Risk Factors  Advanced age  Obesity, Body mass index is 30.04 kg/(m^2).   Other Active Problems  Duodenal cancer with liver mets  Hospital day #   Neurology will sign off. Please call with questions. No neurology follow up needed. Thanks for the consult.  Rosalin Hawking, MD PhD Stroke Neurology 11/08/2014 11:03 PM     To contact Stroke Continuity provider, please refer to [email or web address] After hours, contact General Neurology

## 2014-11-08 NOTE — Progress Notes (Signed)
EKG performed per order. Did not load to EPIC.  Available in pt chart by nurses station.  Claudette Stapler, RN

## 2014-11-08 NOTE — Progress Notes (Signed)
EEG Completed; Results Pending  

## 2014-11-08 NOTE — Discharge Summary (Signed)
Name: Manuel Barr MRN: [id number] DOB: [birth year] 69 y.o. PCP: Shiela Mayer, PA  Date of Admission: 11/07/2014  5:38 PM Date of Discharge: 11/08/2014 Attending Physician: No att. providers found  Discharge Diagnosis:  Principal Problem:   Altered mental status secondary to hypoglycemia   Code stroke admit, R/O for CVA  Active Problems:   Diabetes mellitus, type 2   Duodenal cancer   HLD (hyperlipidemia)   HTN (hypertension)  Discharge Medications:   Medication List    STOP taking these medications        LANTUS 100 UNIT/ML injection  Generic drug:  insulin glargine      TAKE these medications        aspirin 81 MG EC tablet  Take 1 tablet (81 mg total) by mouth daily.     doxycycline 100 MG tablet  Commonly known as:  VIBRA-TABS  Take 1 tablet (100 mg total) by mouth 2 (two) times daily.  Notes to Patient:  You completed this medication course. Do not need to take.      fluconazole 150 MG tablet  Commonly known as:  DIFLUCAN  Take 1 tablet (150 mg total) by mouth once. Repeat in 3 days     fluticasone 50 MCG/ACT nasal spray  Commonly known as:  FLONASE  Place 1 spray into both nostrils daily as needed for allergies or rhinitis.     lisinopril 5 MG tablet  Commonly known as:  PRINIVIL,ZESTRIL  Take 2.5 mg by mouth daily.     metFORMIN 500 MG 24 hr tablet  Commonly known as:  GLUCOPHAGE-XR  Take 1,000 mg by mouth 2 (two) times daily.     ondansetron 8 MG tablet  Commonly known as:  ZOFRAN  Take 8 mg by mouth every 8 (eight) hours as needed for nausea or vomiting.     pantoprazole 40 MG tablet  Commonly known as:  PROTONIX  Take 40 mg by mouth daily.     potassium chloride SA 20 MEQ tablet  Commonly known as:  K-DUR,KLOR-CON  Take 20 mEq by mouth 2 (two) times daily.     pravastatin 40 MG tablet  Commonly known as:  PRAVACHOL  Take 40 mg by mouth at bedtime.        Disposition and follow-up:   Manuel Barr was discharged from [facility] in Good condition.  At the hospital follow up visit please address:  1.  Hypoglycemia - The patient presented with altered mental status and low blood sugars. He took his lantus the morning of his hypoglycemic event instead of the night prior as prescribed. He does not have a short acting insulin regimen. He changes his insulin dose based on his blood sugar levels. -----It is very important to set up a non-adjustable basal insulin dose (only changed by the physician) as well as a short acting insulin regimen the patient can alter based on his blood glucose levels.   2.  Labs / imaging needed at time of follow-up: None  3.  Pending labs/ test needing follow-up: None  Follow-up Appointments: The patient has his own appointments scheduled.  Discharge Instructions: Discharge Instructions    Diet - low sodium heart healthy    Complete by:  As directed      Discharge instructions    Complete by:  As directed   We think your confusion was due to your low blood sugar. It is very important that you follow up with your diabetic doctor  about your blood sugars.  PLEASE TILL YOU SEE YOUR DOCTOR WE RECOMMEND YOU DO NOT TAKE ANY INSULIN AS YOUR BLOOD SUGARS HAVE BEEN NORMAL HERE.   Below are our recommendations for your doctor to follow up on:  1) Please determine the basal dose of lantus for which the patient does not change but takes daily. The patient should not change this dose unless instructed by a physician. Currently, he has been changing this dose daily based on his blood sugar levels. 2) Please start a short acting insulin regimen that the patient can change based on his blood sugar level with appropriate education.     Increase activity slowly    Complete by:  As directed            Consultations: Treatment Team:  Md Stroke, MD  Procedures Performed:  Ct Head Wo Contrast  11/07/2014   CLINICAL DATA:  Code stroke, unsteadiness, speech difficulty  EXAM: CT HEAD  WITHOUT CONTRAST  TECHNIQUE: Contiguous axial images were obtained from the base of the skull through the vertex without intravenous contrast.  COMPARISON:  None.  FINDINGS: Diffuse cortical volume loss noted with proportional ventricular prominence. No acute hemorrhage, infarct, or mass lesion is identified. No midline shift. Orbits and paranasal sinuses are unremarkable. No skull fracture.  IMPRESSION: No acute intracranial finding. Chronic cortical volume loss as above.  Critical Value/emergent results were called by telephone at the time of interpretation on 11/07/2014 at 5:53 pm to Dr. Armida Sans , who verbally acknowledged these results.   Electronically Signed   By: Conchita Paris M.D.   On: 11/07/2014 17:56   Mr Manuel Barr Wo Contrast  11/07/2014   CLINICAL DATA:  69 year old diabetic with speech deficit Initial encounter.  EXAM: MRI HEAD WITHOUT CONTRAST  MRA HEAD WITHOUT CONTRAST  TECHNIQUE: Multiplanar, multiecho pulse sequences of the brain and surrounding structures were obtained without intravenous contrast. Angiographic images of the head were obtained using MRA technique without contrast.  COMPARISON:  11/07/2014 head CT.  No comparison brain MR.  FINDINGS: MRI HEAD FINDINGS  Exam is motion degraded.  No acute infarct.  No intracranial hemorrhage.  No intracranial mass lesion noted on this unenhanced exam.  Global atrophy without hydrocephalus.  Hyperostosis frontalis interna incidentally noted.  Cervical medullary junction, pituitary region, pineal region and orbital structures unremarkable.  MRA HEAD FINDINGS  Exam is motion degraded.  Right vertebral artery is occluded beyond the takeoff of the right posterior inferior cerebellar artery.  Left vertebral artery is dominant and ectatic with atherosclerotic type changes and mild narrowing.  Ectatic basilar artery with mild narrowing and irregularity. Prominent basilar tip without aneurysm.  Nonvisualized anterior inferior cerebellar arteries and left  posterior inferior cerebellar artery.  Nonvisualized right superior cerebellar artery. Mild narrowing left superior cerebellar artery.  Mild narrowing posterior cerebral arteries more notable on the right.  Aplastic A1 segment left anterior cerebral artery. Ectatic A1 segment right anterior cerebral artery. Ectatic anterior communicating artery without saccular aneurysm.  Mild narrowing irregularity M1 segment middle cerebral artery more notable on the left.  Mild middle cerebral artery branch vessel and irregularity bilaterally.  Mild irregularity with minimal bulge cavernous segment internal carotid artery bilaterally. Findings felt to be related to atherosclerotic type changes rather than aneurysm.  Mild narrow supraclinoid segment internal carotid artery greater on left.  IMPRESSION: MRI HEAD FINDINGS  Exam is motion degraded.  No acute infarct.  No intracranial hemorrhage.  No intracranial mass lesion noted on this unenhanced exam.  Global atrophy without hydrocephalus.  MRA HEAD FINDINGS  Intracranial atherosclerotic type changes more notable involving the posterior circulation as detailed.   Electronically Signed   By: Genia Del M.D.   On: 11/07/2014 19:39   Mr Brain Wo Contrast  11/07/2014   CLINICAL DATA:  69 year old diabetic with speech deficit Initial encounter.  EXAM: MRI HEAD WITHOUT CONTRAST  MRA HEAD WITHOUT CONTRAST  TECHNIQUE: Multiplanar, multiecho pulse sequences of the brain and surrounding structures were obtained without intravenous contrast. Angiographic images of the head were obtained using MRA technique without contrast.  COMPARISON:  11/07/2014 head CT.  No comparison brain MR.  FINDINGS: MRI HEAD FINDINGS  Exam is motion degraded.  No acute infarct.  No intracranial hemorrhage.  No intracranial mass lesion noted on this unenhanced exam.  Global atrophy without hydrocephalus.  Hyperostosis frontalis interna incidentally noted.  Cervical medullary junction, pituitary region, pineal  region and orbital structures unremarkable.  MRA HEAD FINDINGS  Exam is motion degraded.  Right vertebral artery is occluded beyond the takeoff of the right posterior inferior cerebellar artery.  Left vertebral artery is dominant and ectatic with atherosclerotic type changes and mild narrowing.  Ectatic basilar artery with mild narrowing and irregularity. Prominent basilar tip without aneurysm.  Nonvisualized anterior inferior cerebellar arteries and left posterior inferior cerebellar artery.  Nonvisualized right superior cerebellar artery. Mild narrowing left superior cerebellar artery.  Mild narrowing posterior cerebral arteries more notable on the right.  Aplastic A1 segment left anterior cerebral artery. Ectatic A1 segment right anterior cerebral artery. Ectatic anterior communicating artery without saccular aneurysm.  Mild narrowing irregularity M1 segment middle cerebral artery more notable on the left.  Mild middle cerebral artery branch vessel and irregularity bilaterally.  Mild irregularity with minimal bulge cavernous segment internal carotid artery bilaterally. Findings felt to be related to atherosclerotic type changes rather than aneurysm.  Mild narrow supraclinoid segment internal carotid artery greater on left.  IMPRESSION: MRI HEAD FINDINGS  Exam is motion degraded.  No acute infarct.  No intracranial hemorrhage.  No intracranial mass lesion noted on this unenhanced exam.  Global atrophy without hydrocephalus.  MRA HEAD FINDINGS  Intracranial atherosclerotic type changes more notable involving the posterior circulation as detailed.   Electronically Signed   By: Genia Del M.D.   On: 11/07/2014 19:39    2D Echo: Left ventricle: The cavity size was normal. There was moderate focal basal and mild concentric hypertrophy. Systolic function was normal. The estimated ejection fraction was in the range of 55% to 60%. Wall motion was normal; there were no regional wall motion abnormalities.  Features are consistent with a pseudonormal left ventricular filling pattern, with concomitant abnormal relaxation and increased filling pressure (grade 2 diastolic dysfunction). - Aortic valve: Moderate diffuse thickening and calcification, consistent with sclerosis. There was trivial regurgitation. - Tricuspid valve: There was trivial regurgitation.  Admission HPI:   Manuel Barr is a 70 y.o. 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 Symptom onset was this afternoon around 16:45 when patient was asymptomatic, watching TV. At this time, patient went to the bedroom to attempt a phone call and experienced difficulty doing so and wife states he was confused, babbling his words, shaky, and tremulous. Also, reports feeling weak, fatigued but not more so on one-side  versus another. Wife states that she did not notice any focal weakness or facial droop. Both patient and his wife deny any prior history of similar symptoms of confusion, weakness. Patient reports that his blood sugars have recently been in the 200s with reading this morning being 230 He was also recently hospitalized this month at [facility] for DKA. He took 25 units of Lantus and glucose was 91 on admission. States he has had previous episodes of hypoglycemia where he described symptoms of feeling shaky, tremulous. Patient denies any shortness of breath, chest pain, fever, neck stiffness, headache. He does endorse some chills. Of note, patient recently started chemotherapy treatment with FOLFOX for his duodenal cancer with liver mets. According to patients wife, he is supposed to receive treatment every 2 weeks with the first having occurred  on August 19.  In the emergency department per neurology and rapid response notes, patient presented with the inability to follow most commands with occasionally being able to follow simple, one-step commands. Also, noted that his speech was word salad. He was given 1/2 amp of D50 with some resolution of speech noted. According to patient and his wife, patient vomited at sometime in the emergency department and both noticed a great improvement of his speech and mental status at that time. Also, in the ED patient had a CT head without contrast negative for acute intracranial findings. MRI brain without contrast negative for acute infarction, intracranial hemorrhage, or mass lesion.   Hospital Course by problem list:  Acute onset confusion with speech difficulty: Patient admitted with confusion, possible aphasia, and word salad. Believed to be associated with hypoglycemia. Pt states hes felt shaky and tremulous in the past with sugars in 80-90s. Improved with dextrose administratio and returned to baseline shortly after. Hypoglycemia likely due to recent decrease in PO intake with vomiting 2/2 chemo. Neuro workup for possible stroke/TIA  With CT head and MRI were negative for cerebrovascular accident. Electrolytes and CBC stable. Prelim carotid doppler read: 1-39% ICA stenosis bilaterally. Stroke/TAI workup showed LDL 92, HgbA1c 8.4. Echo showed EF 55-60%, no thrombus noted in report, telemetery monitoring without atria fibrillation. At discharge patient was back to baseline.  Hypertension: Stable while inpatient. Lisinopril initially held to allow for permissive hypertension, was restarted on discharge- lisinopril 2.30m daily.  Hyperlipidemia -LDL 92. Continued home pravastatin 426mdaily.  Diabetes mellitus type II: home meds of metformin 100049mid and Lantus 30Units qhs. However, he is not currently adhering to his medications. Currently not taking metformin bc oncologists mentioned not to take  it once before chemo. He changes his lantus dose daily based on his blood sugars. He normally takes his lantus at night however took it the morning of hypoglycemic event because he forgot to take it the night before. He does not have any short acting insulin. Patient has an appointment with his endocrinologist for 11/09/2014. Pt encouraged to keep this appointment. Throughout admission, CBGs- ranged between- 118- 169 without any hypoglycemic agents or insulin in >24hrs. Recommended patient not take any insulin till he follows up with his endocrinologist.   Duodenal cancer with liver mets -patient is s/p bile duct drainage with biliary stenting recently started on FOLFOX every 2 weeks on 8/19. Vomiting was previously controlled on Zofran 8mg57mN, will continue current regimen on discharge as vomiting resolved by discharge.  Discharge Vitals:   BP 114/64 mmHg  Pulse 75  Temp(Src) 97.7 F (36.5 C) (Oral)  Resp 20  Ht 5' 6" (1.676 m)  Wt 186 lb (84.369 kg)  BMI 30.04 kg/m2  SpO2 99%  Discharge Labs:  Results for orders placed or performed during the hospital encounter of 11/07/14 (from the past 24 hour(s))  Glucose, capillary     Status: Abnormal   Collection Time: 11/08/14 12:02 PM  Result Value Ref Range   Glucose-Capillary 127 (H) 65 - 99 mg/dL  Glucose, capillary     Status: Abnormal   Collection Time: 11/08/14  4:23 PM  Result Value Ref Range   Glucose-Capillary 169 (H) 65 - 99 mg/dL   Signed: Bethena Roys, MD 11/09/2014, 7:34 AM

## 2014-11-11 NOTE — ED Provider Notes (Signed)
CSN: [id number]     Arrival date & time 11/07/14  1737 History   First MD Initiated Contact with Patient 11/07/14 1740     Chief Complaint  Patient presents with  . Code Stroke     (Consider location/radiation/quality/duration/timing/severity/associated sxs/prior Treatment) HPI Comments: 69 y.o. caucasian male with a past medical history of hypertension, hyperlipidemia, diabetes mellitus type II, and cancer presents to the emergency department with sudden onset confusion and difficulty with speech. Symptom onset was this afternoon around 16:45 and patient comes in as code stroke.Wife reports that pt  Became acutely confused, and started "babbling" his words, shaky, and tremulous.No hx of strokes. No fevers, chills.  The history is provided by the patient.    Past Medical History  Diagnosis Date  . Type II diabetes mellitus   . Hyperlipemia     Archie Endo 11/07/2014  . Hypertension     Archie Endo 11/07/2014  . Kidney stones   . 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 Liver cancer dx'd 08/2014  . Macular degeneration, bilateral   . GERD (gastroesophageal reflux disease)   . Sleep apnea     "suppose to use a mask but I haven't in awhile" (11/07/2014)   Past Surgical History  Procedure Laterality Date  . Abdominal exploration surgery  08/2014    attempted whipple per wife -but unable to resect /notes 11/07/2014  . Bile duct stent placement  09/2014    Archie Endo 11/07/2014  . Cholecystectomy open  08/2014   Family History  Problem Relation Age of Onset  . Diabetes Mother   . Diabetes Father   . Heart disease Father   . Emphysema Father    Social History  Substance Use Topics  . Smoking status: Never Smoker   . Smokeless tobacco: Never Used  . Alcohol Use: No    Review of Systems  Neurological: Positive for light-headedness.  Psychiatric/Behavioral: Positive for confusion.  All other systems reviewed and are negative.     Allergies  Codeine  Home  Medications   Prior to Admission medications   Medication Sig Start Date End Date Taking? Authorizing Provider  fluticasone (FLONASE) 50 MCG/ACT nasal spray Place 1 spray into both nostrils daily as needed for allergies or rhinitis.    Yes Historical Provider, MD  lisinopril (PRINIVIL,ZESTRIL) 5 MG tablet Take 2.5 mg by mouth daily.   Yes Historical Provider, MD  metFORMIN (GLUCOPHAGE-XR) 500 MG 24 hr tablet Take 1,000 mg by mouth 2 (two) times daily.   Yes Historical Provider, MD  ondansetron (ZOFRAN) 8 MG tablet Take 8 mg by mouth every 8 (eight) hours as needed for nausea or vomiting.    Yes Historical Provider, MD  pantoprazole (PROTONIX) 40 MG tablet Take 40 mg by mouth daily.   Yes Historical Provider, MD  potassium chloride SA (K-DUR,KLOR-CON) 20 MEQ tablet Take 20 mEq by mouth 2 (two) times daily.   Yes Historical Provider, MD  pravastatin (PRAVACHOL) 40 MG tablet Take 40 mg by mouth at bedtime.   Yes Historical Provider, MD  aspirin EC 81 MG EC tablet Take 1 tablet (81 mg total) by mouth daily. 11/08/14   Ejiroghene Arlyce Dice, MD  doxycycline (VIBRA-TABS) 100 MG tablet Take 1 tablet (100 mg total) by mouth 2 (two) times daily. Patient not taking: Reported on 11/07/2014 10/26/12   Darreld Mclean, MD  fluconazole (DIFLUCAN) 150 MG tablet Take 1 tablet (150 mg total) by mouth once. Repeat in 3 days Patient not  taking: Reported on 11/07/2014 10/26/12   Gay Filler Copland, MD   BP 114/64 mmHg  Pulse 75  Temp(Src) 97.7 F (36.5 C) (Oral)  Resp 20  Ht 5\' 6"  (1.676 m)  Wt 186 lb (84.369 kg)  BMI 30.04 kg/m2  SpO2 99% Physical Exam  Constitutional: He is oriented to person, place, and time. He appears well-developed and well-nourished.  HENT:  Head: Normocephalic and atraumatic.  Eyes: EOM are normal. Pupils are equal, round, and reactive to light.  Neck: Normal range of motion. Neck supple. No JVD present.  Cardiovascular: Normal rate and regular rhythm.   Pulmonary/Chest: Effort  normal and breath sounds normal. No respiratory distress. He has no wheezes.  Abdominal: Soft. Bowel sounds are normal. He exhibits no distension. There is no tenderness. There is no rebound and no guarding.  Neurological: He is alert and oriented to person, place, and time. No cranial nerve deficit. Coordination normal.  NIHSS - 0 No objective sensory deficits, Motor strength upper and lower extremity 4+ and equal Normal cerebellar exam  Skin: Skin is warm and dry.  Nursing note and vitals reviewed.   ED Course  Procedures (including critical care time) Labs Review Labs Reviewed  CBC - Abnormal; Notable for the following:    RBC 3.79 (*)    Hemoglobin 10.7 (*)    HCT 32.0 (*)    RDW 15.6 (*)    All other components within normal limits  COMPREHENSIVE METABOLIC PANEL - Abnormal; Notable for the following:    Potassium 3.2 (*)    Calcium 8.8 (*)    Total Protein 6.2 (*)    Albumin 3.0 (*)    Alkaline Phosphatase 128 (*)    Total Bilirubin 1.3 (*)    All other components within normal limits  COMPREHENSIVE METABOLIC PANEL - Abnormal; Notable for the following:    Chloride 113 (*)    Glucose, Bld 113 (*)    Calcium 7.7 (*)    Total Protein 4.9 (*)    Albumin 2.3 (*)    All other components within normal limits  CBC - Abnormal; Notable for the following:    WBC 3.9 (*)    RBC 3.23 (*)    Hemoglobin 9.2 (*)    HCT 27.6 (*)    RDW 16.2 (*)    All other components within normal limits  LIPID PANEL - Abnormal; Notable for the following:    Triglycerides 170 (*)    HDL 17 (*)    All other components within normal limits  GLUCOSE, CAPILLARY - Abnormal; Notable for the following:    Glucose-Capillary 142 (*)    All other components within normal limits  GLUCOSE, CAPILLARY - Abnormal; Notable for the following:    Glucose-Capillary 118 (*)    All other components within normal limits  GLUCOSE, CAPILLARY - Abnormal; Notable for the following:    Glucose-Capillary 108 (*)     All other components within normal limits  GLUCOSE, CAPILLARY - Abnormal; Notable for the following:    Glucose-Capillary 127 (*)    All other components within normal limits  GLUCOSE, CAPILLARY - Abnormal; Notable for the following:    Glucose-Capillary 169 (*)    All other components within normal limits  I-STAT CHEM 8, ED - Abnormal; Notable for the following:    Potassium 3.1 (*)    Calcium, Ion 1.06 (*)    Hemoglobin 11.2 (*)    HCT 33.0 (*)    All other components within normal limits  CBG MONITORING, ED - Abnormal; Notable for the following:    Glucose-Capillary 148 (*)    All other components within normal limits  PROTIME-INR  APTT  DIFFERENTIAL  URINE RAPID DRUG SCREEN, HOSP PERFORMED  I-STAT TROPOININ, ED  CBG MONITORING, ED    Imaging Review No results found. I have personally reviewed and evaluated these images and lab results as part of my medical decision-making.   EKG Interpretation   Date/Time:  Monday November 07 2014 19:04:10 EDT Ventricular Rate:  89 PR Interval:    QRS Duration: 63 QT Interval:  368 QTC Calculation: 448 R Axis:   46 Text Interpretation:  Atrial flutter Probable anteroseptal infarct, old  Borderline T abnormalities, inferior leads Minimal ST elevation, inferior  leads ED PHYSICIAN INTERPRETATION AVAILABLE IN [facility] Confirmed  by TEST, Record ([id number]) on 11/08/2014 7:36:42 AM      MDM   Final diagnoses:  Dysarthria  Aphasia    Pt comes in as code stroke. On my exam there are no objective findings - but he does appear a bit confused, and also too wordy. Neuro also concerned about the speech centers being affected, but they dont think TPA is needed given overall improvement of the symptoms. Pt is not a drinker. Will admit.    Varney Biles, MD 11/11/14 2004

## 2015-02-09 DIAGNOSIS — I2699 Other pulmonary embolism without acute cor pulmonale: Secondary | ICD-10-CM

## 2015-02-09 HISTORY — DX: Other pulmonary embolism without acute cor pulmonale: I26.99

## 2015-03-05 ENCOUNTER — Encounter (HOSPITAL_COMMUNITY): Payer: Self-pay | Admitting: *Deleted

## 2015-03-05 ENCOUNTER — Inpatient Hospital Stay (HOSPITAL_COMMUNITY)
Admission: EM | Admit: 2015-03-05 | Discharge: 2015-03-09 | DRG: 166 | Disposition: A | Discharge status: Skilled Nursing Facility | Attending: Internal Medicine | Admitting: Internal Medicine

## 2015-03-05 ENCOUNTER — Emergency Department (HOSPITAL_COMMUNITY)

## 2015-03-05 DIAGNOSIS — Z6824 Body mass index (BMI) 24.0-24.9, adult: Secondary | ICD-10-CM

## 2015-03-05 DIAGNOSIS — G4730 Sleep apnea, unspecified: Secondary | ICD-10-CM | POA: Diagnosis present

## 2015-03-05 DIAGNOSIS — Z833 Family history of diabetes mellitus: Secondary | ICD-10-CM

## 2015-03-05 DIAGNOSIS — E785 Hyperlipidemia, unspecified: Secondary | ICD-10-CM | POA: Diagnosis present

## 2015-03-05 DIAGNOSIS — C787 Secondary malignant neoplasm of liver and intrahepatic bile duct: Secondary | ICD-10-CM | POA: Diagnosis present

## 2015-03-05 DIAGNOSIS — I10 Essential (primary) hypertension: Secondary | ICD-10-CM | POA: Diagnosis present

## 2015-03-05 DIAGNOSIS — E119 Type 2 diabetes mellitus without complications: Secondary | ICD-10-CM | POA: Diagnosis present

## 2015-03-05 DIAGNOSIS — J189 Pneumonia, unspecified organism: Secondary | ICD-10-CM | POA: Diagnosis not present

## 2015-03-05 DIAGNOSIS — H3530 Unspecified macular degeneration: Secondary | ICD-10-CM | POA: Diagnosis present

## 2015-03-05 DIAGNOSIS — C170 Malignant neoplasm of duodenum: Secondary | ICD-10-CM | POA: Diagnosis present

## 2015-03-05 DIAGNOSIS — I2699 Other pulmonary embolism without acute cor pulmonale: Secondary | ICD-10-CM | POA: Diagnosis present

## 2015-03-05 DIAGNOSIS — Z825 Family history of asthma and other chronic lower respiratory diseases: Secondary | ICD-10-CM

## 2015-03-05 DIAGNOSIS — Z87442 Personal history of urinary calculi: Secondary | ICD-10-CM

## 2015-03-05 DIAGNOSIS — R0602 Shortness of breath: Secondary | ICD-10-CM | POA: Diagnosis not present

## 2015-03-05 DIAGNOSIS — R609 Edema, unspecified: Secondary | ICD-10-CM | POA: Diagnosis present

## 2015-03-05 DIAGNOSIS — D62 Acute posthemorrhagic anemia: Secondary | ICD-10-CM | POA: Diagnosis present

## 2015-03-05 DIAGNOSIS — E43 Unspecified severe protein-calorie malnutrition: Secondary | ICD-10-CM | POA: Diagnosis present

## 2015-03-05 DIAGNOSIS — D500 Iron deficiency anemia secondary to blood loss (chronic): Secondary | ICD-10-CM | POA: Diagnosis present

## 2015-03-05 DIAGNOSIS — I82622 Acute embolism and thrombosis of deep veins of left upper extremity: Secondary | ICD-10-CM | POA: Diagnosis present

## 2015-03-05 DIAGNOSIS — N179 Acute kidney failure, unspecified: Secondary | ICD-10-CM | POA: Diagnosis present

## 2015-03-05 DIAGNOSIS — D649 Anemia, unspecified: Secondary | ICD-10-CM

## 2015-03-05 DIAGNOSIS — E118 Type 2 diabetes mellitus with unspecified complications: Secondary | ICD-10-CM

## 2015-03-05 LAB — HEPATIC FUNCTION PANEL
ALT: 14 U/L — AB (ref 17–63)
AST: 25 U/L (ref 15–41)
Albumin: 1.7 g/dL — ABNORMAL LOW (ref 3.5–5.0)
Alkaline Phosphatase: 415 U/L — ABNORMAL HIGH (ref 38–126)
BILIRUBIN INDIRECT: 0.7 mg/dL (ref 0.3–0.9)
Bilirubin, Direct: 0.3 mg/dL (ref 0.1–0.5)
TOTAL PROTEIN: 5 g/dL — AB (ref 6.5–8.1)
Total Bilirubin: 1 mg/dL (ref 0.3–1.2)

## 2015-03-05 LAB — BASIC METABOLIC PANEL
ANION GAP: 9 (ref 5–15)
BUN: 23 mg/dL — AB (ref 6–20)
CO2: 23 mmol/L (ref 22–32)
Calcium: 7.8 mg/dL — ABNORMAL LOW (ref 8.9–10.3)
Chloride: 105 mmol/L (ref 101–111)
Creatinine, Ser: 1.33 mg/dL — ABNORMAL HIGH (ref 0.61–1.24)
GFR, EST NON AFRICAN AMERICAN: 54 mL/min — AB (ref >60)
Glucose, Bld: 272 mg/dL — ABNORMAL HIGH (ref 65–99)
POTASSIUM: 4.7 mmol/L (ref 3.5–5.1)
SODIUM: 137 mmol/L (ref 135–145)

## 2015-03-05 LAB — CBC WITH DIFFERENTIAL/PLATELET
Basophils Absolute: 0 10*3/uL (ref 0.0–0.1)
Basophils Relative: 0 %
EOS ABS: 0 10*3/uL (ref 0.0–0.7)
Eosinophils Relative: 0 %
HCT: 19.2 % — ABNORMAL LOW (ref 39.0–52.0)
Hemoglobin: 6.1 g/dL — CL (ref 13.0–17.0)
Lymphocytes Relative: 5 %
Lymphs Abs: 0.4 10*3/uL — ABNORMAL LOW (ref 0.7–4.0)
MCH: 28.5 pg (ref 26.0–34.0)
MCHC: 31.8 g/dL (ref 30.0–36.0)
MCV: 89.7 fL (ref 78.0–100.0)
MONO ABS: 0.2 10*3/uL (ref 0.1–1.0)
Monocytes Relative: 2 %
NEUTROS PCT: 93 %
Neutro Abs: 7.8 10*3/uL — ABNORMAL HIGH (ref 1.7–7.7)
PLATELETS: 196 10*3/uL (ref 150–400)
RBC: 2.14 MIL/uL — AB (ref 4.22–5.81)
RDW: 18.2 % — ABNORMAL HIGH (ref 11.5–15.5)
WBC: 8.4 10*3/uL (ref 4.0–10.5)

## 2015-03-05 LAB — BRAIN NATRIURETIC PEPTIDE: B Natriuretic Peptide: 113.2 pg/mL — ABNORMAL HIGH (ref 0.0–100.0)

## 2015-03-05 LAB — I-STAT TROPONIN, ED: Troponin i, poc: 0.06 ng/mL (ref 0.00–0.08)

## 2015-03-05 LAB — PREPARE RBC (CROSSMATCH)

## 2015-03-05 LAB — ABO/RH: ABO/RH(D): O POS

## 2015-03-05 LAB — GLUCOSE, CAPILLARY: Glucose-Capillary: 254 mg/dL — ABNORMAL HIGH (ref 65–99)

## 2015-03-05 MED ORDER — DEXTROSE 5 % IV SOLN
1.0000 g | Freq: Two times a day (BID) | INTRAVENOUS | Status: DC
  Administered 2015-03-06 – 2015-03-08 (×5): 1 g via INTRAVENOUS
  Filled 2015-03-05 (×5): qty 1

## 2015-03-05 MED ORDER — ONDANSETRON HCL 4 MG PO TABS: 8.0000 mg | ORAL_TABLET | Freq: Three times a day (TID) | ORAL | Status: DC | PRN

## 2015-03-05 MED ORDER — SODIUM CHLORIDE 0.9 % IJ SOLN
10.0000 mL | INTRAMUSCULAR | Status: DC | PRN
  Administered 2015-03-08: 10 mL
  Filled 2015-03-05: qty 40

## 2015-03-05 MED ORDER — SODIUM CHLORIDE 0.9 % IV SOLN
10.0000 mL/h | Freq: Once | INTRAVENOUS | Status: AC
  Administered 2015-03-05: 10 mL/h via INTRAVENOUS

## 2015-03-05 MED ORDER — VANCOMYCIN HCL IN DEXTROSE 750-5 MG/150ML-% IV SOLN
750.0000 mg | Freq: Two times a day (BID) | INTRAVENOUS | Status: DC
  Administered 2015-03-05 – 2015-03-08 (×6): 750 mg via INTRAVENOUS
  Filled 2015-03-05 (×6): qty 150

## 2015-03-05 MED ORDER — SODIUM CHLORIDE 0.9 % IJ SOLN: 3.0000 mL | Freq: Two times a day (BID) | INTRAMUSCULAR | Status: DC

## 2015-03-05 MED ORDER — FLUTICASONE PROPIONATE 50 MCG/ACT NA SUSP
1.0000 | Freq: Every day | NASAL | Status: DC | PRN
  Filled 2015-03-05: qty 16

## 2015-03-05 MED ORDER — ENSURE ENLIVE PO LIQD: 237.0000 mL | Freq: Two times a day (BID) | ORAL | Status: DC

## 2015-03-05 MED ORDER — SODIUM CHLORIDE 0.9 % IJ SOLN: 3.0000 mL | INTRAMUSCULAR | Status: DC | PRN

## 2015-03-05 MED ORDER — INSULIN ASPART 100 UNIT/ML ~~LOC~~ SOLN
0.0000 [IU] | Freq: Every day | SUBCUTANEOUS | Status: DC
Start: 2015-03-05 — End: 2015-03-09
  Administered 2015-03-05: 3 [IU] via SUBCUTANEOUS

## 2015-03-05 MED ORDER — PANTOPRAZOLE SODIUM 40 MG PO TBEC: 40.0000 mg | DELAYED_RELEASE_TABLET | Freq: Every day | ORAL | Status: DC | PRN

## 2015-03-05 MED ORDER — CYCLOBENZAPRINE HCL 5 MG PO TABS: 5.0000 mg | ORAL_TABLET | Freq: Every evening | ORAL | Status: DC | PRN

## 2015-03-05 MED ORDER — INSULIN GLARGINE 100 UNIT/ML ~~LOC~~ SOLN
5.0000 [IU] | Freq: Every day | SUBCUTANEOUS | Status: DC
  Administered 2015-03-06 – 2015-03-08 (×3): 5 [IU] via SUBCUTANEOUS
  Filled 2015-03-05 (×5): qty 0.05

## 2015-03-05 MED ORDER — SODIUM CHLORIDE 0.9 % IV SOLN: 250.0000 mL | INTRAVENOUS | Status: DC | PRN

## 2015-03-05 MED ORDER — DEXTROSE 5 % IV SOLN
1.0000 g | INTRAVENOUS | Status: AC
  Administered 2015-03-05: 1 g via INTRAVENOUS
  Filled 2015-03-05: qty 1

## 2015-03-05 MED ORDER — VANCOMYCIN HCL 500 MG IV SOLR
500.0000 mg | Freq: Two times a day (BID) | INTRAVENOUS | Status: DC
  Filled 2015-03-05 (×2): qty 500

## 2015-03-05 MED ORDER — INSULIN ASPART 100 UNIT/ML ~~LOC~~ SOLN
0.0000 [IU] | Freq: Three times a day (TID) | SUBCUTANEOUS | Status: DC
Start: 2015-03-06 — End: 2015-03-09
  Administered 2015-03-06 (×2): 5 [IU] via SUBCUTANEOUS
  Administered 2015-03-06: 2 [IU] via SUBCUTANEOUS
  Administered 2015-03-07 – 2015-03-08 (×3): 3 [IU] via SUBCUTANEOUS
  Administered 2015-03-08 – 2015-03-09 (×2): 2 [IU] via SUBCUTANEOUS

## 2015-03-05 NOTE — ED Notes (Signed)
Per EMS, pt from home complains of shortness of breath for the past several days. Pt was discharged from [facility] on 12/21, states he was supposed to be sent home on O2 but has not yet received it. EMS state pt O2 sat was 81% on RA upon arrival. Pt now 100% upon arrival to hospital. Pt states he feels more short of breath when moving. Pt has left lower mass, states he has been on hospice since last week. Pt also complains of diarrhea this morning.

## 2015-03-05 NOTE — ED Notes (Signed)
Bed: [id number] Expected date:  Expected time:  Means of arrival:  Comments: EMS- 69yo SOB

## 2015-03-05 NOTE — Progress Notes (Signed)
ANTIBIOTIC CONSULT NOTE - INITIAL  Pharmacy Consult for Vancomycin, antibiotic renal dose adjustment Indication: pneumonia  Allergies  Allergen Reactions  . Codeine Nausea And Vomiting  . Other     Pt is on a Mechanical Soft Diet, has a stint in stomach.   Patient Measurements: Height: 5\' 6"  (167.6 cm) Weight: 149 lb 14.6 oz (68 kg) IBW/kg (Calculated) : 63.8  Vital Signs: Temp: 98.5 F (36.9 C) (12/25 1903) Temp Source: Oral (12/25 1903) BP: 136/79 mmHg (12/25 1903) Pulse Rate: 100 (12/25 1903) Intake/Output from previous day:    Labs:  Recent Labs  03/05/15 1530  WBC 8.4  HGB 6.1*  PLT 196  CREATININE 1.33*   Estimated Creatinine Clearance: 48.6 mL/min (by C-G formula based on Cr of 1.33). No results for input(s): VANCOTROUGH, VANCOPEAK, VANCORANDOM, GENTTROUGH, GENTPEAK, GENTRANDOM, TOBRATROUGH, TOBRAPEAK, TOBRARND, AMIKACINPEAK, AMIKACINTROU, AMIKACIN in the last 72 hours.   Microbiology: No results found for this or any previous visit (from the past 720 hour(s)).  Medical History: Past Medical History  Diagnosis Date  . Type II diabetes mellitus ([place])   . Hyperlipemia     Archie Endo 11/07/2014  . Hypertension     Archie Endo 11/07/2014  . Kidney stones   . Cancer of duodenum ([place]) dx'd 08/2014    w/liver mets/notes 11/07/2014  . Liver cancer ([place]) dx'd 08/2014  . Macular degeneration, bilateral   . GERD (gastroesophageal reflux disease)   . Sleep apnea     "suppose to use a mask but I haven't in awhile" (11/07/2014)    Medications:  Anti-infectives    Start     Dose/Rate Route Frequency Ordered Stop   03/05/15 2200  ceFEPIme (MAXIPIME) 1 g in dextrose 5 % 50 mL IVPB     1 g 100 mL/hr over 30 Minutes Intravenous 3 times per day 03/05/15 1903 03/13/15 2159     Assessment: 6 yoM admitted on 12/25 with worsening SOB, hemoptysis.  PMH significant for metastatic ampullary carcinoma, duodenal adenocarcinoma, pleurX catheter in place for recurrent pleural effusion,  and PE on Lovenox anticoagulation.  He was at [facility] (11/29 - 12/7) and treated for MRSA pneumonia with Vancomycin, then discharged on Clindamycin/Doxycyline through 02/20/15.  Pharmacy is consulted to dose vancomycin and renally adjust Cefepime doses.  Today, 03/05/2015: Tm 98.5 WBC 8.4 SCr 1.33 with CrCl ~ 48 ml/min  Antimicrobials this admission: 12/25 >> Cefepime >> 12/25 >> Vanc >>   Levels/dose changes 12/6 Previous admission at [facility]:  Vanc trough 20.2 (12/6) on Vanc 1250 q12h with SCr ~ 0.65.  See Care Everywhere notes for details.  Goal of Therapy:  Vancomycin trough level 15-20 mcg/ml  Plan:   Cefepime 1g IV q12h  Vancomycin 750 mg IV q12h.  Measure Vanc trough at steady state.  Follow up renal fxn, culture results, and clinical course.  Gretta Arab PharmD, BCPS Pager [phone] 03/05/2015 7:12 PM

## 2015-03-05 NOTE — H&P (Signed)
[facility] History and Physical  Manuel Barr [id number] DOB: November 21, 1945 DOA: 03/05/2015  Referring physician: Dr. Lita Mains PCP: Shiela Mayer, [place]   Chief Complaint: Shortness of breath and weakness and coughing of blood  HPI: Manuel Barr is a 69 y.o. male  With recent history of admission into the hospital earlier this month which patient was diagnosed with PE and pneumonia. 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 Patient was placed on Lovenox. He reports that he came to the hospital because he felt weaker and was having increased shortness of breath and dyspnea on exertion. The problem since onset which she reports was roughly 2 days ago has been persistent and gradually getting worse. Nothing he is aware of makes it better. Activity makes it worse. He also endorses hemoptysis   Review of Systems:  Constitutional:  No weight loss, night sweats, Fevers, chills, fatigue.  HEENT:  No headaches, Difficulty swallowing,Tooth/dental problems,Sore throat,  No sneezing, itching, ear ache, nasal congestion, post nasal drip,  Cardio-vascular:  No chest pain, Orthopnea, PND, swelling in lower extremities, anasarca, dizziness, palpitations  GI:  No heartburn, indigestion, abdominal pain, nausea, vomiting, diarrhea, change in bowel habits, loss of appetite  Resp:  No shortness of breath with exertion or at rest. No excess mucus, no productive cough, No non-productive cough, + coughing up of blood.No change in color of mucus.No wheezing.No chest wall deformity  Skin:  no rash or lesions.  GU:  no dysuria, change in color of urine, no urgency or frequency. No flank pain.  Musculoskeletal:  No joint pain or swelling. No decreased range of motion. No back pain.  Psych:  No change in mood or affect. No depression or anxiety. No memory loss.   Past Medical History  Diagnosis Date  . Type II diabetes mellitus ([place])   . Hyperlipemia     Archie Endo  11/07/2014  . Hypertension     Archie Endo 11/07/2014  . Kidney stones   . Cancer of duodenum ([place]) dx'd 08/2014    w/liver mets/notes 11/07/2014  . Liver cancer ([place]) dx'd 08/2014  . Macular degeneration, bilateral   . GERD (gastroesophageal reflux disease)   . Sleep apnea     "suppose to use a mask but I haven't in awhile" (11/07/2014)   Past Surgical History  Procedure Laterality Date  . Abdominal exploration surgery  08/2014    attempted whipple per wife -but unable to resect /notes 11/07/2014  . Bile duct stent placement  09/2014    Archie Endo 11/07/2014  . Cholecystectomy open  08/2014   Social History:  reports that he has never smoked. He has never used smokeless tobacco. He reports that he does not drink alcohol or use illicit drugs.  Allergies  Allergen Reactions  . Codeine Nausea And Vomiting  . Other     Pt is on a Mechanical Soft Diet, has a stint in stomach.    Family History  Problem Relation Age of Onset  . Diabetes Mother   . Diabetes Father   . Heart disease Father   . Emphysema Father      Prior to Admission medications   Medication Sig Start Date End Date Taking? Authorizing Provider  cyclobenzaprine (FLEXERIL) 10 MG tablet Take 5 mg by mouth at bedtime as needed. Muscle spasms   Yes Historical Provider, MD  enoxaparin (LOVENOX) 80 MG/0.8ML injection Inject 70 mg into the skin 2 (two) times daily. Inject in Left side in the morning, and inject in Right side  at night 02/09/15 08/10/15 Yes Historical Provider, MD  fluticasone (FLONASE) 50 MCG/ACT nasal spray Place 1 spray into both nostrils daily as needed for allergies or rhinitis.    Yes Historical Provider, MD  Insulin Glargine (LANTUS SOLOSTAR) 100 UNIT/ML Solostar Pen Inject 5-10 Units into the skin daily as needed. For high blood sugar level.  If around 200 take 5 units, if above 300 take 8-10 units   Yes Historical Provider, MD  ondansetron (ZOFRAN) 8 MG tablet Take 8 mg by mouth every 8 (eight) hours as needed for nausea  or vomiting.    Yes Historical Provider, MD  pantoprazole (PROTONIX) 40 MG tablet Take 40 mg by mouth daily as needed (heartburn).    Yes Historical Provider, MD  PREDNISONE PO Take by mouth. Pt is unable to confirm dose of pills or how long course is running, did state " its just 1 pill a day"   Yes Historical Provider, MD  [place] [facility]   Yes Historical Provider, MD  aspirin EC 81 MG EC tablet Take 1 tablet (81 mg total) by mouth daily. Patient not taking: Reported on 03/05/2015 11/08/14   Ejiroghene Arlyce Dice, MD  metFORMIN (GLUCOPHAGE-XR) 500 MG 24 hr tablet Take 500-1,000 mg by mouth 2 (two) times daily as needed (high blood sugar). Reported on 03/05/2015    Historical Provider, MD   Physical Exam: Filed Vitals:   03/05/15 1429 03/05/15 1500 03/05/15 1646  BP: 110/70  115/79  Pulse: 110 100 100  Temp: 98 F (36.7 C)  98.1 F (36.7 C)  TempSrc: Oral  Oral  Resp: 26 23 25   SpO2: 100% 97% 100%    Wt Readings from Last 3 Encounters:  11/07/14 84.369 kg (186 lb)  10/26/12 85.911 kg (189 lb 6.4 oz)    General:  Appears calm and comfortable Eyes: PERRL, normal lids, irises & conjunctiva ENT: grossly normal hearing, lips & tongue Neck: no LAD, masses or thyromegaly Cardiovascular: RRR, no m/r/g. No LE edema. Respiratory: Equal chest rise, hemoptysis, speaking in full sentences, no wheezes Abdomen: soft, nt, nd Skin: no rash or induration seen on limited exam Musculoskeletal: grossly normal tone BUE/BLE Psychiatric: grossly normal mood and affect, speech fluent and appropriate Neurologic: grossly non-focal.          Labs on Admission:  Basic Metabolic Panel:  Recent Labs Lab 03/05/15 1530  NA 137  K 4.7  CL 105  CO2 23  GLUCOSE 272*  BUN 23*  CREATININE 1.33*  CALCIUM 7.8*   Liver Function Tests:  Recent Labs Lab 03/05/15 1530  AST 25  ALT 14*  ALKPHOS 415*  BILITOT 1.0  PROT 5.0*  ALBUMIN 1.7*   No results for input(s):  LIPASE, AMYLASE in the last 168 hours. No results for input(s): AMMONIA in the last 168 hours. CBC:  Recent Labs Lab 03/05/15 1530  WBC 8.4  NEUTROABS 7.8*  HGB 6.1*  HCT 19.2*  MCV 89.7  PLT 196   Cardiac Enzymes: No results for input(s): CKTOTAL, CKMB, CKMBINDEX, TROPONINI in the last 168 hours.  BNP (last 3 results)  Recent Labs  03/05/15 1530  BNP 113.2*    ProBNP (last 3 results) No results for input(s): PROBNP in the last 8760 hours.  CBG: No results for input(s): GLUCAP in the last 168 hours.  Radiological Exams on Admission: Dg Chest 2 View  03/05/2015  CLINICAL DATA:  Worsening shortness of breath for 2 days. History of duodenum and liver cancer. Pleural  effusions. EXAM: CHEST  2 VIEW COMPARISON:  None available FINDINGS: Right IJ port catheter tip SVC RA junction. Extensive consolidative airspace process throughout the left lung with central air bronchograms. Underlying left lung mass not excluded. Blunting of the left costophrenic angle compatible with a left effusion. No pneumothorax. Right lung remains relatively clear. Small right midline nodule appears calcified, suspicious for granulomatous disease. No right effusion. Trachea is midline. Biliary and duodenal stents noted. No acute osseous finding. IMPRESSION: Extensive consolidative airspace process throughout the left lung compatible with underlying known lung mass and likely superimposed left lung pneumonia/pneumonitis. Associated small to moderate left effusion Other chronic and postoperative findings as above. Electronically Signed   By: Jerilynn Mages.  Shick M.D.   On: 03/05/2015 15:06    EKG: Independently reviewed. Sinus tachycardia with no ST elevations or depressions  Assessment/Plan Principal Problem:   PNA (pneumonia) - Difficult to know whether this is secondary to pulmonary embolism which was diagnosed at the beginning of this month versus active infection. Patient was treated for pneumonia at the beginning  of the month as such will treat for healthcare associated pneumonia and antibiotics can always be scaled back.    Anemia - Patient currently with hemoptysis. Has been on Lovenox for recent diagnosis of pulmonary embolism. At this juncture patient presents high risk of mortality with anticoagulation. As such will hold Lovenox and after blood transfusion we'll plan on consulting oncology/hematology for further recommendations regarding anticoagulation moving forward. - Reassess hemoglobin levels next a.m. and after transfusion.   Active Problems:   Diabetes mellitus, type 2 (HCC) -Continue Lantus - SSI and diabetic diet as well as night coverage added    Duodenal cancer ([place]) -Patient to follow-up with his oncologist after discharge    Pulmonary embolism ([place]) -Hold Lovenox -Consult hematologist next a.m. for recommendations regarding anticoagulation moving forward   Code Status: Full DVT Prophylaxis: SCD's unable to use medication due to profound anemia and  Family Communication: Discussed directly with patient and family member Disposition Plan: Telemetry monitoring should patient condition worsen we'll plan on transferring to stepdown unit  Time spent: > 55 minutes  Velvet Bathe [facility] Pager [phone]

## 2015-03-05 NOTE — ED Provider Notes (Signed)
CSN: [id number]     Arrival date & time 03/05/15  1411 History   First MD Initiated Contact with Patient 03/05/15 1502     Chief Complaint  Patient presents with  . Shortness of Breath     (Consider location/radiation/quality/duration/timing/severity/associated sxs/prior Treatment) HPI With 3 days of worsening shortness of breath especially with any exertion. Has history of ampullary carcinoma with metastases. Has recurrent left-sided pleural effusion for which has a PleurX cath in place. Recently hospitalized for recurrent effusion, pulmonary embolism and MRSA pneumonia. Patient denies any fevers or chills. No new pain. He has had some increased lower extremity swelling. Patient states he was supposed to be started on oxygen but has yet to receive it. Past Medical History  Diagnosis Date  . Type II diabetes mellitus ([place])   . Hyperlipemia     Archie Endo 11/07/2014  . Hypertension     Archie Endo 11/07/2014  . Kidney stones   . 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 Liver cancer ([place]) dx'd 08/2014  . Macular degeneration, bilateral   . GERD (gastroesophageal reflux disease)   . Sleep apnea     "suppose to use a mask but I haven't in awhile" (11/07/2014)   Past Surgical History  Procedure Laterality Date  . Abdominal exploration surgery  08/2014    attempted whipple per wife -but unable to resect /notes 11/07/2014  . Bile duct stent placement  09/2014    Archie Endo 11/07/2014  . Cholecystectomy open  08/2014   Family History  Problem Relation Age of Onset  . Diabetes Mother   . Diabetes Father   . Heart disease Father   . Emphysema Father    Social History  Substance Use Topics  . Smoking status: Never Smoker   . Smokeless tobacco: Never Used  . Alcohol Use: No    Review of Systems  Constitutional: Negative for fever and chills.  Respiratory: Positive for cough and shortness of breath. Negative for wheezing.   Cardiovascular: Positive for leg swelling.  Negative for chest pain and palpitations.  Gastrointestinal: Negative for nausea, vomiting and abdominal pain.  Musculoskeletal: Negative for back pain and neck pain.  Skin: Negative for rash and wound.  Neurological: Positive for light-headedness. Negative for weakness, numbness and headaches.  All other systems reviewed and are negative.     Allergies  Codeine and Other  Home Medications   Prior to Admission medications   Medication Sig Start Date End Date Taking? Authorizing Provider  cyclobenzaprine (FLEXERIL) 10 MG tablet Take 5 mg by mouth at bedtime as needed. Muscle spasms   Yes Historical Provider, MD  fluticasone (FLONASE) 50 MCG/ACT nasal spray Place 1 spray into both nostrils daily as needed for allergies or rhinitis.    Yes Historical Provider, MD  ondansetron (ZOFRAN) 8 MG tablet Take 8 mg by mouth every 8 (eight) hours as needed for nausea or vomiting.    Yes Historical Provider, MD  pantoprazole (PROTONIX) 40 MG tablet Take 40 mg by mouth daily as needed (heartburn).    Yes Historical Provider, MD  feeding supplement, GLUCERNA SHAKE, (GLUCERNA SHAKE) LIQD Take 237 mLs by mouth 3 (three) times daily between meals. 03/09/15   Venetia Maxon Rama, MD  ferrous sulfate 325 (65 FE) MG tablet Take 1 tablet (325 mg total) by mouth 2 (two) times daily with a meal. 03/09/15   Christina P Rama, MD  insulin aspart (NOVOLOG) 100 UNIT/ML injection Inject 0-15 Units into the skin 3 (  three) times daily with meals. 03/09/15   Venetia Maxon Rama, MD  insulin aspart (NOVOLOG) 100 UNIT/ML injection Inject 0-5 Units into the skin at bedtime. 03/09/15   Venetia Maxon Rama, MD  insulin glargine (LANTUS) 100 UNIT/ML injection Inject 0.05 mLs (5 Units total) into the skin at bedtime. 03/09/15   Venetia Maxon Rama, MD  oxycodone (OXY-IR) 5 MG capsule Take 1 capsule (5 mg total) by mouth every 4 (four) hours as needed for pain. 03/09/15   Christina P Rama, MD   BP 107/73 mmHg  Pulse 98  Temp(Src) 97.7 F  (36.5 C) (Oral)  Resp 18  Ht 5\' 6"  (1.676 m)  Wt 149 lb 14.6 oz (68 kg)  BMI 24.21 kg/m2  SpO2 99% Physical Exam  Constitutional: He is oriented to person, place, and time. He appears well-developed and well-nourished. No distress.  HENT:  Head: Normocephalic and atraumatic.  Mouth/Throat: Oropharynx is clear and moist.  Eyes: EOM are normal. Pupils are equal, round, and reactive to light.  Neck: Normal range of motion. Neck supple. No JVD present.  Cardiovascular: Normal rate and regular rhythm.  Exam reveals no gallop and no friction rub.   No murmur heard. Pulmonary/Chest: No respiratory distress. He has no wheezes. He has no rales.  Tachypnea with decreased breath sounds to the left lung field especially at the base. Patient has PleurX catheter in the left lateral chest and port in the right upper chest. No evidence of erythema, warmth or tenderness.  Abdominal: Soft. Bowel sounds are normal. He exhibits no distension and no mass. There is no tenderness. There is no rebound and no guarding.  Musculoskeletal: Normal range of motion. He exhibits edema. He exhibits no tenderness.  1+ bilateral lower extremity edema.  Neurological: He is alert and oriented to person, place, and time.  Moves all extremities without deficit. Sensation is fully intact.  Skin: Skin is warm and dry. No rash noted. No erythema. There is pallor.  Psychiatric: He has a normal mood and affect. His behavior is normal.  Nursing note and vitals reviewed.   ED Course  Procedures (including critical care time) Labs Review Labs Reviewed  CBC WITH DIFFERENTIAL/PLATELET - Abnormal; Notable for the following:    RBC 2.14 (*)    Hemoglobin 6.1 (*)    HCT 19.2 (*)    RDW 18.2 (*)    Neutro Abs 7.8 (*)    Lymphs Abs 0.4 (*)    All other components within normal limits  BASIC METABOLIC PANEL - Abnormal; Notable for the following:    Glucose, Bld 272 (*)    BUN 23 (*)    Creatinine, Ser 1.33 (*)    Calcium 7.8  (*)    GFR calc non Af Amer 54 (*)    All other components within normal limits  HEPATIC FUNCTION PANEL - Abnormal; Notable for the following:    Total Protein 5.0 (*)    Albumin 1.7 (*)    ALT 14 (*)    Alkaline Phosphatase 415 (*)    All other components within normal limits  BRAIN NATRIURETIC PEPTIDE - Abnormal; Notable for the following:    B Natriuretic Peptide 113.2 (*)    All other components within normal limits  GLUCOSE, CAPILLARY - Abnormal; Notable for the following:    Glucose-Capillary 254 (*)    All other components within normal limits  GLUCOSE, CAPILLARY - Abnormal; Notable for the following:    Glucose-Capillary 205 (*)    All other components  within normal limits  CBC - Abnormal; Notable for the following:    RBC 3.18 (*)    Hemoglobin 9.1 (*)    HCT 27.4 (*)    RDW 17.0 (*)    All other components within normal limits  GLUCOSE, CAPILLARY - Abnormal; Notable for the following:    Glucose-Capillary 210 (*)    All other components within normal limits  PREALBUMIN - Abnormal; Notable for the following:    Prealbumin 3.3 (*)    All other components within normal limits  RETICULOCYTES - Abnormal; Notable for the following:    Retic Ct Pct 3.6 (*)    RBC. 3.35 (*)    All other components within normal limits  IRON AND TIBC - Abnormal; Notable for the following:    Iron 14 (*)    TIBC 122 (*)    Saturation Ratios 11 (*)    All other components within normal limits  FERRITIN - Abnormal; Notable for the following:    Ferritin 736 (*)    All other components within normal limits  OCCULT BLOOD X 1 CARD TO LAB, STOOL - Abnormal; Notable for the following:    Fecal Occult Bld POSITIVE (*)    All other components within normal limits  GLUCOSE, CAPILLARY - Abnormal; Notable for the following:    Glucose-Capillary 135 (*)    All other components within normal limits  GLUCOSE, CAPILLARY - Abnormal; Notable for the following:    Glucose-Capillary 152 (*)    All other  components within normal limits  GLUCOSE, CAPILLARY - Abnormal; Notable for the following:    Glucose-Capillary 166 (*)    All other components within normal limits  CBC - Abnormal; Notable for the following:    RBC 3.27 (*)    Hemoglobin 9.2 (*)    HCT 27.9 (*)    RDW 17.7 (*)    Platelets 142 (*)    All other components within normal limits  BASIC METABOLIC PANEL - Abnormal; Notable for the following:    Glucose, Bld 169 (*)    Calcium 7.6 (*)    All other components within normal limits  GLUCOSE, CAPILLARY - Abnormal; Notable for the following:    Glucose-Capillary 170 (*)    All other components within normal limits  GLUCOSE, CAPILLARY - Abnormal; Notable for the following:    Glucose-Capillary 183 (*)    All other components within normal limits  GLUCOSE, CAPILLARY - Abnormal; Notable for the following:    Glucose-Capillary 128 (*)    All other components within normal limits  BASIC METABOLIC PANEL - Abnormal; Notable for the following:    Glucose, Bld 138 (*)    Calcium 7.7 (*)    All other components within normal limits  CBC - Abnormal; Notable for the following:    RBC 3.24 (*)    Hemoglobin 9.3 (*)    HCT 28.0 (*)    RDW 17.7 (*)    Platelets 134 (*)    All other components within normal limits  GLUCOSE, CAPILLARY - Abnormal; Notable for the following:    Glucose-Capillary 151 (*)    All other components within normal limits  GLUCOSE, CAPILLARY - Abnormal; Notable for the following:    Glucose-Capillary 122 (*)    All other components within normal limits  GLUCOSE, CAPILLARY - Abnormal; Notable for the following:    Glucose-Capillary 133 (*)    All other components within normal limits  GLUCOSE, CAPILLARY - Abnormal; Notable for the following:  Glucose-Capillary 131 (*)    All other components within normal limits  CULTURE, BLOOD (ROUTINE X 2)  CULTURE, BLOOD (ROUTINE X 2)  CULTURE, EXPECTORATED SPUTUM-ASSESSMENT  GRAM STAIN  HIV ANTIBODY (ROUTINE  TESTING)  STREP PNEUMONIAE URINARY ANTIGEN  SAVE SMEAR  CREATININE, SERUM  VANCOMYCIN, TROUGH  GLUCOSE, CAPILLARY  GLUCOSE, CAPILLARY  GLUCOSE, CAPILLARY  I-STAT TROPOININ, ED  TYPE AND SCREEN  PREPARE RBC (CROSSMATCH)  ABO/RH    Imaging Review No results found. I have personally reviewed and evaluated these images and lab results as part of my medical decision-making.   EKG Interpretation   Date/Time:  Sunday March 05 2015 14:23:37 EST Ventricular Rate:  101 PR Interval:  163 QRS Duration: 86 QT Interval:  331 QTC Calculation: 429 R Axis:   51 Text Interpretation:  Sinus tachycardia Anterior infarct, old Baseline  wander in lead(s) V5 Confirmed by ZACKOWSKI  MD, [place] [phone]) on  03/05/2015 2:43:23 PM Also confirmed by Lita Mains  MD, Darrah Dredge ([id number])  on  03/05/2015 4:54:06 PM      MDM   Final diagnoses:  Symptomatic anemia  SOB (shortness of breath)   Patient with left sided infiltrate. Afebrile with normal white blood cell count. Has occasional cough. Denies any melena or gross blood in stool. Hemoglobin is 6. Will transfuse packed red blood cells. Discussed with [facility] hospitalist. Discussed possible antibiotics. We will evaluate in the emergency department    Julianne Rice, MD 03/11/15 1525

## 2015-03-05 NOTE — ED Notes (Signed)
Pt enroute to [facility] and nurse drawing labs when pt gets back

## 2015-03-06 DIAGNOSIS — C170 Malignant neoplasm of duodenum: Secondary | ICD-10-CM | POA: Diagnosis not present

## 2015-03-06 DIAGNOSIS — I2699 Other pulmonary embolism without acute cor pulmonale: Secondary | ICD-10-CM | POA: Diagnosis not present

## 2015-03-06 DIAGNOSIS — J189 Pneumonia, unspecified organism: Secondary | ICD-10-CM | POA: Diagnosis present

## 2015-03-06 DIAGNOSIS — R0602 Shortness of breath: Secondary | ICD-10-CM | POA: Diagnosis present

## 2015-03-06 DIAGNOSIS — D500 Iron deficiency anemia secondary to blood loss (chronic): Secondary | ICD-10-CM | POA: Diagnosis present

## 2015-03-06 DIAGNOSIS — E785 Hyperlipidemia, unspecified: Secondary | ICD-10-CM | POA: Diagnosis present

## 2015-03-06 DIAGNOSIS — H3530 Unspecified macular degeneration: Secondary | ICD-10-CM | POA: Diagnosis present

## 2015-03-06 DIAGNOSIS — Z87442 Personal history of urinary calculi: Secondary | ICD-10-CM | POA: Diagnosis not present

## 2015-03-06 DIAGNOSIS — C787 Secondary malignant neoplasm of liver and intrahepatic bile duct: Secondary | ICD-10-CM | POA: Diagnosis present

## 2015-03-06 DIAGNOSIS — I10 Essential (primary) hypertension: Secondary | ICD-10-CM | POA: Diagnosis present

## 2015-03-06 DIAGNOSIS — D62 Acute posthemorrhagic anemia: Secondary | ICD-10-CM | POA: Diagnosis present

## 2015-03-06 DIAGNOSIS — R609 Edema, unspecified: Secondary | ICD-10-CM | POA: Diagnosis not present

## 2015-03-06 DIAGNOSIS — E1129 Type 2 diabetes mellitus with other diabetic kidney complication: Secondary | ICD-10-CM | POA: Diagnosis not present

## 2015-03-06 DIAGNOSIS — Z6824 Body mass index (BMI) 24.0-24.9, adult: Secondary | ICD-10-CM | POA: Diagnosis not present

## 2015-03-06 DIAGNOSIS — G4730 Sleep apnea, unspecified: Secondary | ICD-10-CM | POA: Diagnosis present

## 2015-03-06 DIAGNOSIS — Z825 Family history of asthma and other chronic lower respiratory diseases: Secondary | ICD-10-CM | POA: Diagnosis not present

## 2015-03-06 DIAGNOSIS — E119 Type 2 diabetes mellitus without complications: Secondary | ICD-10-CM | POA: Diagnosis present

## 2015-03-06 DIAGNOSIS — I82622 Acute embolism and thrombosis of deep veins of left upper extremity: Secondary | ICD-10-CM | POA: Diagnosis not present

## 2015-03-06 DIAGNOSIS — N179 Acute kidney failure, unspecified: Secondary | ICD-10-CM | POA: Diagnosis present

## 2015-03-06 DIAGNOSIS — Z833 Family history of diabetes mellitus: Secondary | ICD-10-CM | POA: Diagnosis not present

## 2015-03-06 DIAGNOSIS — C241 Malignant neoplasm of ampulla of Vater: Secondary | ICD-10-CM | POA: Diagnosis not present

## 2015-03-06 DIAGNOSIS — D649 Anemia, unspecified: Secondary | ICD-10-CM | POA: Diagnosis not present

## 2015-03-06 DIAGNOSIS — E43 Unspecified severe protein-calorie malnutrition: Secondary | ICD-10-CM | POA: Diagnosis not present

## 2015-03-06 LAB — CBC
HEMATOCRIT: 27.4 % — AB (ref 39.0–52.0)
Hemoglobin: 9.1 g/dL — ABNORMAL LOW (ref 13.0–17.0)
MCH: 28.6 pg (ref 26.0–34.0)
MCHC: 33.2 g/dL (ref 30.0–36.0)
MCV: 86.2 fL (ref 78.0–100.0)
PLATELETS: 179 10*3/uL (ref 150–400)
RBC: 3.18 MIL/uL — ABNORMAL LOW (ref 4.22–5.81)
RDW: 17 % — AB (ref 11.5–15.5)
WBC: 9.9 10*3/uL (ref 4.0–10.5)

## 2015-03-06 LAB — STREP PNEUMONIAE URINARY ANTIGEN: Strep Pneumo Urinary Antigen: NEGATIVE

## 2015-03-06 LAB — IRON AND TIBC
IRON: 14 ug/dL — AB (ref 45–182)
SATURATION RATIOS: 11 % — AB (ref 17.9–39.5)
TIBC: 122 ug/dL — ABNORMAL LOW (ref 250–450)
UIBC: 108 ug/dL

## 2015-03-06 LAB — RETICULOCYTES
RBC.: 3.35 MIL/uL — ABNORMAL LOW (ref 4.22–5.81)
Retic Count, Absolute: 120.6 10*3/uL (ref 19.0–186.0)
Retic Ct Pct: 3.6 % — ABNORMAL HIGH (ref 0.4–3.1)

## 2015-03-06 LAB — GLUCOSE, CAPILLARY
GLUCOSE-CAPILLARY: 205 mg/dL — AB (ref 65–99)
Glucose-Capillary: 210 mg/dL — ABNORMAL HIGH (ref 65–99)
Glucose-Capillary: 135 mg/dL — ABNORMAL HIGH (ref 65–99)
Glucose-Capillary: 152 mg/dL — ABNORMAL HIGH (ref 65–99)

## 2015-03-06 LAB — OCCULT BLOOD X 1 CARD TO LAB, STOOL: Fecal Occult Bld: POSITIVE — AB

## 2015-03-06 LAB — HIV ANTIBODY (ROUTINE TESTING W REFLEX): HIV SCREEN 4TH GENERATION: NONREACTIVE

## 2015-03-06 LAB — SAVE SMEAR

## 2015-03-06 LAB — PREALBUMIN: PREALBUMIN: 3.3 mg/dL — AB (ref 18–38)

## 2015-03-06 LAB — FERRITIN: FERRITIN: 736 ng/mL — AB (ref 24–336)

## 2015-03-06 MED ORDER — FERROUS SULFATE 325 (65 FE) MG PO TABS
325.0000 mg | ORAL_TABLET | Freq: Two times a day (BID) | ORAL | Status: DC
  Administered 2015-03-06 – 2015-03-09 (×6): 325 mg via ORAL
  Filled 2015-03-06 (×7): qty 1

## 2015-03-06 MED ORDER — GLUCERNA SHAKE PO LIQD
237.0000 mL | Freq: Three times a day (TID) | ORAL | Status: DC
  Administered 2015-03-06 – 2015-03-09 (×6): 237 mL via ORAL
  Filled 2015-03-06 (×11): qty 237

## 2015-03-06 MED ORDER — OXYCODONE HCL 5 MG PO TABS
5.0000 mg | ORAL_TABLET | Freq: Four times a day (QID) | ORAL | Status: DC | PRN
Start: 2015-03-06 — End: 2015-03-09
  Administered 2015-03-06 – 2015-03-09 (×5): 5 mg via ORAL
  Filled 2015-03-06 (×5): qty 1

## 2015-03-06 NOTE — Consult Note (Signed)
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   Chief Complaint  Patient presents with  . Shortness of Breath  : I came to the hospital yesterday because I felt so weak.  HPI: Mr. Scheirer is a very nice but unfortunate 69 year old white male. He has metastatic ampullary carcinoma. This was diagnosed at [facility]. He is a [place] patient. They allow him to go to [facility].  He was found have progressive disease recently. He was told that he was not a candidate for any further treatment.  He says he was admitted to [facility] for 8 days. He has shortness of breath. He had a lot of fluid removed from his left lung. They finally put in a Pleurx catheter.  He began to feel poorly over the weekend. On Christmas Day, he said he called EMS and was brought to [facility].  He had been on blood thinner because of a pulmonary embolism. He said that he been having rectal bleeding for 2 days.  When he came in, his hemoglobin was 6. His white cell count was 8.4. His platelet count was 196,000. He has gotten 2 units of red blood cells.  We were asked to see him to try to help with management of these issues.  He says that he is not bleeding right now. He says his Pleurx catheter has not been opened for several days.  His appetite is poor. His albumin is 1.7. Blood sugar is high. Calcium 7.8.  His overall performance status is ECOG 3 at best.  Again, he does not have much of an appetite. Possibly some Marinol might help with this.  He served our country in [place]. There probably was [employer] exposure.               Past Medical History  Diagnosis Date  . Type II diabetes mellitus ([place])   . Hyperlipemia     Archie Endo 11/07/2014  . Hypertension     Archie Endo 11/07/2014  . Kidney stones   . 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 Liver cancer ([place]) dx'd 08/2014  . Macular  degeneration, bilateral   . GERD (gastroesophageal reflux disease)   . Sleep apnea     "suppose to use a mask but I haven't in awhile" (11/07/2014)  :  Past Surgical History  Procedure Laterality Date  . Abdominal exploration surgery  08/2014    attempted whipple per wife -but unable to resect /notes 11/07/2014  . Bile duct stent placement  09/2014    Archie Endo 11/07/2014  . Cholecystectomy open  08/2014  :   Current facility-administered medications:  .  0.9 %  sodium chloride infusion, 250 mL, Intravenous, PRN, Velvet Bathe, MD .  ceFEPIme (MAXIPIME) 1 g in dextrose 5 % 50 mL IVPB, 1 g, Intravenous, Q12H, Velvet Bathe, MD, 1 g at 03/06/15 0811 .  cyclobenzaprine (FLEXERIL) tablet 5 mg, 5 mg, Oral, QHS PRN, Velvet Bathe, MD .  feeding supplement (ENSURE ENLIVE) (ENSURE ENLIVE) liquid 237 mL, 237 mL, Oral, BID BM, Velvet Bathe, MD, 237 mL at 03/06/15 1000 .  fluticasone (FLONASE) 50 MCG/ACT nasal spray 1 spray, 1 spray, Each Nare, Daily PRN, Velvet Bathe, MD .  insulin aspart (novoLOG) injection 0-15 Units, 0-15 Units, Subcutaneous, TID WC, Velvet Bathe, MD, 5 Units at 03/06/15 [phone] .  insulin aspart (novoLOG) injection 0-5 Units, 0-5 Units, Subcutaneous, QHS, Velvet Bathe, MD, 3 Units  at 03/05/15 2247 .  insulin glargine (LANTUS) injection 5 Units, 5 Units, Subcutaneous, QHS, Velvet Bathe, MD .  ondansetron (ZOFRAN) tablet 8 mg, 8 mg, Oral, Q8H PRN, Velvet Bathe, MD .  oxyCODONE (Oxy IR/ROXICODONE) immediate release tablet 5 mg, 5 mg, Oral, Q6H PRN, Velvet Bathe, MD .  pantoprazole (PROTONIX) EC tablet 40 mg, 40 mg, Oral, Daily PRN, Velvet Bathe, MD .  sodium chloride 0.9 % injection 10-40 mL, 10-40 mL, Intracatheter, PRN, Velvet Bathe, MD .  sodium chloride 0.9 % injection 3 mL, 3 mL, Intravenous, Q12H, Velvet Bathe, MD, 3 mL at 03/05/15 2200 .  sodium chloride 0.9 % injection 3 mL, 3 mL, Intravenous, PRN, Velvet Bathe, MD .  vancomycin (VANCOCIN) IVPB 750 mg/150 ml premix, 750 mg,  Intravenous, Q12H, Randa Spike, RPH, 750 mg at 03/06/15 [id number]:  . ceFEPime (MAXIPIME) IV  1 g Intravenous Q12H  . feeding supplement (ENSURE ENLIVE)  237 mL Oral BID BM  . insulin aspart  0-15 Units Subcutaneous TID WC  . insulin aspart  0-5 Units Subcutaneous QHS  . insulin glargine  5 Units Subcutaneous QHS  . sodium chloride  3 mL Intravenous Q12H  . vancomycin  750 mg Intravenous Q12H  :  Allergies  Allergen Reactions  . Codeine Nausea And Vomiting  . Other     Pt is on a Mechanical Soft Diet, has a stint in stomach.  :  Family History  Problem Relation Age of Onset  . Diabetes Mother   . Diabetes Father   . Heart disease Father   . Emphysema Father   :  Social History   Social History  . Marital Status: Married    Spouse Name: N/A  . Number of Children: N/A  . Years of Education: N/A   Occupational History  . Not on file.   Social History Main Topics  . Smoking status: Never Smoker   . Smokeless tobacco: Never Used  . Alcohol Use: No  . Drug Use: No  . Sexual Activity: Not Currently   Other Topics Concern  . Not on file   Social History Narrative  :  Pertinent items are noted in HPI.  Exam: Patient Vitals for the past 24 hrs:  BP Temp Temp src Pulse Resp SpO2 Height Weight  03/06/15 0425 140/86 mmHg 98 F (36.7 C) Oral 90 20 99 % - -  03/06/15 0215 127/79 mmHg 97.8 F (36.6 C) Oral 95 (!) 22 98 % - -  03/06/15 0148 129/82 mmHg 98.4 F (36.9 C) Oral 96 (!) 28 98 % - -  03/05/15 2004 130/89 mmHg 98.3 F (36.8 C) Oral 99 (!) 30 98 % - -  03/05/15 1903 136/79 mmHg 98.5 F (36.9 C) Oral 100 (!) 24 100 % 5\' 6"  (1.676 m) 149 lb 14.6 oz (68 kg)  03/05/15 1800 128/80 mmHg - - 100 (!) 28 98 % - -  03/05/15 1750 128/80 mmHg 97.6 F (36.4 C) Oral 100 20 99 % - -  03/05/15 1733 - 97.4 F (36.3 C) Oral - - - - -  03/05/15 1727 127/80 mmHg - - 100 20 98 % - -  03/05/15 1646 115/79 mmHg 98.1 F (36.7 C) Oral 100 25 100 % - -  03/05/15 1500 - - - 100  23 97 % - -  03/05/15 1429 110/70 mmHg 98 F (36.7 C) Oral 110 26 100 % - -    chronically ill-appearing white male in no obvious distress. Head and  neck exam shows no ocular or oral lesions. He has some slight temporal muscle wasting. No adenopathy noted in the neck. Lungs are clear on the right side. Left side is decreased about two thirds the way up. Cardiac exam is tachycardic but regular. Has a 1/6 systolic ejection murmur. Abdomen is soft. Bowel sounds are present. There is no guarding or rebound tenderness. He has no palpable liver or spleen tip. Extremities shows some muscle atrophy in upper or lower extremities. Skin exam shows no ecchymosis or petechia. Neurological exam is nonfocal.    Recent Labs  03/05/15 1530  WBC 8.4  HGB 6.1*  HCT 19.2*  PLT 196    Recent Labs  03/05/15 1530  NA 137  K 4.7  CL 105  CO2 23  GLUCOSE 272*  BUN 23*  CREATININE 1.33*  CALCIUM 7.8*    Blood smear review:  None done.  Pathology: None     Assessment and Plan:  Mr. Zajicek is a 69 year old gentleman. He has progressive ampullary adenocarcinoma. He has been followed out at [facility]. There are no further plans for therapy on him and it sounds like comfort care is what the goal is.  From my point of view, he needs to get off blood thinner and just be placed a IVC filter.  By doing this, hopefully, any potential thrombi in his legs will not go to his lungs.  His performance status is poor. I totally understand why he is not a candidate for any systemic chemotherapy. Typically, ampullary carcinoma was treated similarly to pancreatic cancer. I think any chemotherapy that is used would have less than 10% chance of effectiveness.  I really would not get too aggressive with this anemia. He said he had rectal bleeding for 2 or 3 days prior to coming to the hospital. I'm sure this is wise hemoglobin is so low. Despite his underlying malignancy and that probable hypercoagulable state, he is  still at risk for bleeding because of with the tumor is located.  I just don't believe that anticoagulation benefit outweighs the risk of bleeding. This is why I think a filter would be reasonable.  We had a long talk about his prognosis. I told him that I thought that he probably will not make it through January. I would just be surprised if he did given his overall condition that I saw him in today. I have to believe that a lot of what he is going through is from the malignancy.  He has a Pleurx catheter in his left pleural space. The catheter needs to be open to try to drain fluid that is building up. This might make him feel better.  I believe that hospice would be the best option for him as an outpatient. However, he is not to excited about hospice and the help that they are providing right now.  I really believe that his quality of life should be the primary goal.  I did not talk to him about end-of-life issues. That probably will have to be addressed with him again sooner than later.  I thanked him for serving  Our country. He is a true American hero for being in the service and making the sacrifices that he did.  I really wish that there is more that we can do for him.  I spent about 45 minutes with him today.  Ramond Dial 2:52

## 2015-03-06 NOTE — Progress Notes (Signed)
Initial Nutrition Assessment  DOCUMENTATION CODES:   Severe malnutrition in context of chronic illness  INTERVENTION:  - Will d/c Ensure Enlive - Will order Glucerna Shake TID, each supplement provides 220 kcal and 10 grams of protein - RD will continue to monitor for needs  NUTRITION DIAGNOSIS:   Increased nutrient needs related to catabolic illness, cancer and cancer related treatments as evidenced by estimated needs.  GOAL:   Patient will meet greater than or equal to 90% of their needs  MONITOR:   PO intake, Supplement acceptance, Weight trends, Labs, Skin, I & O's  REASON FOR ASSESSMENT:   Malnutrition Screening Tool  ASSESSMENT:   69 year-old with recent history of admission into the hospital earlier this month which patient was diagnosed with PE and pneumonia. 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 Patient was placed on Lovenox. He reports that he came to the hospital because he felt weaker and was having increased shortness of breath and dyspnea on exertion. The problem since onset which she reports was roughly 2 days ago has been persistent and gradually getting worse. Nothing he is aware of makes it better. Activity makes it worse. He also endorses hemoptysis  Pt seen for MST. BMI indicates normal weight status. No intakes documented. Pt reports that for breakfast he ate a few bites of banana and yogurt. Visualized lunch tray with a few bites of cottage cheese and a few sips of apple juice taken. Pt was sipping on diet ginger ale during RD visit. He and visitor, who is at bedside, report all information.  PTA pt's appetite was very poor and he would typically eat 2 very small meals/snacks per day. He would have something light for breakfast around 10 AM and then again around dinner time. Items would include things such as those items he consumed today or a small bowl of cereal or soup. Pt reports that since he had stent placed in stomach he was  advised to consume only soft foods to avoid buildup around the stent. He states that he has not had overt abdominal pain or nausea with intakes today or PTA. He denies chewing or swallowing issues. Pt does state that he previously was experiencing taste alteration (which he was unable to further specify) but states that this is beginning to resolve and that foods are beginning to taste good again.   Visitor states that in May 2016 pt weighed 230 lbs. This indicates 81 lb weight loss (35% body weight) in 7 months which is significant for time frame. Chart indicates that over the past 4 months pt has lost 37 lbs (20% body weight) which is also significant for time frame. Physical assessment indicates severe muscle and fat wasting.   Pt was drinking Glucerna Shake PTA and he is interested in having Ensure Enlive switched to Glucerna. RN also asked RD in huddle this AM to make this switch for the pt. Pt states he was also drinking Gatorade diluted with water PTA for hydration. Not meeting needs at this time. Will continue to monitor for additional needs. Medications reviewed. Labs reviewed; CBGs: 205 and 210 mg/dL.   Diet Order:  Diet Carb Modified Fluid consistency:: Thin; Room service appropriate?: Yes  Skin:  Reviewed, no issues  Last BM:  12/25  Height:   Ht Readings from Last 1 Encounters:  03/05/15 5\' 6"  (1.676 m)    Weight:   Wt Readings from Last 1 Encounters:  03/05/15 149 lb 14.6 oz (68 kg)  Ideal Body Weight:  64.54 kg (kg)  BMI:  Body mass index is 24.21 kg/(m^2).  Estimated Nutritional Needs:   Kcal:  2100-2300  Protein:  80-95 grams  Fluid:  2-2.2 L/day  EDUCATION NEEDS:   No education needs identified at this time     Jarome Matin, RD, LDN Inpatient Clinical Dietitian Pager # [phone] After hours/weekend pager # [phone]

## 2015-03-06 NOTE — Progress Notes (Signed)
Drained 236ml of clear amber fluid from pleurx catheter. Patient tolerated well, will continue to monitor.

## 2015-03-06 NOTE — Progress Notes (Signed)
[facility] PROGRESS NOTE  DEMON HOTOP [id number] DOB: January 20, 1946 DOA: 03/05/2015 PCP: Shiela Mayer, PA  Assessment/Plan: Principal Problem:   PNA (pneumonia) - Not entirely clear whether x-ray findings are secondary to old infection at the beginning of this month versus PE. Patient does not seem like he has active infection in that WBC count within normal limits and he is afebrile. Nonetheless at this juncture will cover with cefepime and vancomycin.  Active Problems:   Diabetes mellitus, type 2 (HCC) - Continue SSI and Lantus. 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 Disposition Plan: pending recommendations from specialist   Consultants:  Hematology  Procedures:  none  Antibiotics:  Cefepime and vancomycin  HPI/Subjective: Pt has no new complaints today. Would like for me to change his diet off of heart healthy  Objective: Filed Vitals:   03/06/15 0425 03/06/15 1355  BP: 140/86 116/78  Pulse: 90 100  Temp: 98 F (36.7 C) 97.5 F (36.4 C)  Resp: 20 22    Intake/Output Summary (Last 24 hours) at 03/06/15 1356 Last data filed at 03/06/15 [id number]  Gross per 24 hour  Intake    975 ml  Output    620 ml  Net    355 ml   Filed Weights   03/05/15 1903  Weight: 68 kg (149 lb 14.6 oz)    Exam:   General:  Pt in nad, alert and awake  Cardiovascular: rrr, no mrg  Respiratory: cta bl, no wheezes  Abdomen: soft, ND, no rebound tenderness, no guarding  Musculoskeletal: No cyanosis or clubbing   Data Reviewed: Basic Metabolic Panel:  Recent Labs Lab 03/05/15 1530  NA 137  K 4.7  CL 105   CO2 23  GLUCOSE 272*  BUN 23*  CREATININE 1.33*  CALCIUM 7.8*   Liver Function Tests:  Recent Labs Lab 03/05/15 1530  AST 25  ALT 14*  ALKPHOS 415*  BILITOT 1.0  PROT 5.0*  ALBUMIN 1.7*   No results for input(s): LIPASE, AMYLASE in the last 168 hours. No results for input(s): AMMONIA in the last 168 hours. CBC:  Recent Labs Lab 03/05/15 1530 03/06/15 1057  WBC 8.4 9.9  NEUTROABS 7.8*  --   HGB 6.1* 9.1*  HCT 19.2* 27.4*  MCV 89.7 86.2  PLT 196 179   Cardiac Enzymes: No results for input(s): CKTOTAL, CKMB, CKMBINDEX, TROPONINI in the last 168 hours. BNP (last 3 results)  Recent Labs  03/05/15 1530  BNP 113.2*    ProBNP (last 3 results) No results for input(s): PROBNP in the last 8760 hours.  CBG:  Recent Labs Lab 03/05/15 2129 03/06/15 0752 03/06/15 1143  GLUCAP 254* 205* 210*    No results found for this or any previous visit (from the past 240 hour(s)).   Studies: Dg Chest 2 View  03/05/2015  CLINICAL DATA:  Worsening shortness of breath for 2 days. History of duodenum and liver cancer. Pleural effusions. EXAM: CHEST  2 VIEW COMPARISON:  None available FINDINGS: Right IJ port catheter tip SVC RA junction. Extensive consolidative airspace process throughout the left lung with central air bronchograms. Underlying left lung mass not excluded. Blunting of the left costophrenic  angle compatible with a left effusion. No pneumothorax. Right lung remains relatively clear. Small right midline nodule appears calcified, suspicious for granulomatous disease. No right effusion. Trachea is midline. Biliary and duodenal stents noted. No acute osseous finding. IMPRESSION: Extensive consolidative airspace process throughout the left lung compatible with underlying known lung mass and likely superimposed left lung pneumonia/pneumonitis. Associated small to moderate left effusion Other chronic and postoperative findings as above. Electronically Signed   By: Jerilynn Mages.  Shick M.D.    On: 03/05/2015 15:06    Scheduled Meds: . ceFEPime (MAXIPIME) IV  1 g Intravenous Q12H  . feeding supplement (ENSURE ENLIVE)  237 mL Oral BID BM  . insulin aspart  0-15 Units Subcutaneous TID WC  . insulin aspart  0-5 Units Subcutaneous QHS  . insulin glargine  5 Units Subcutaneous QHS  . sodium chloride  3 mL Intravenous Q12H  . vancomycin  750 mg Intravenous Q12H   Continuous Infusions:   Time spent: > 35 minutes    Velvet Bathe  [facility] Pager [phone] If 7PM-7AM, please contact night-coverage at [email or web address], password [facility] 03/06/2015, 1:56 PM

## 2015-03-06 NOTE — Progress Notes (Signed)
Hospice and [facility]) Chaplain visit: Pt awake in bed, fatigued in affect, but open along with wife Seth Bake to meet chaplain and have spiritual support.  Pt and wife shared about importance of faith, and the strong support of their church, [employer], as well as many other churches throughout the country.  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  Pt and wife welcomed additional spiritual support, and pt hopeful to return home soon.  Visit cut short by need to have PT eval. Carlus Pavlov, ThM, [place]
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
## 2015-03-06 NOTE — Progress Notes (Signed)
Inpatient [facility]-Room 1408-[facility] RN Visit  This is a related admission to [facility] diagnosis of Cancer of Duodenum.  Patient is a Full Code.  He was admitted to hospice services on 12/19.  Patient seen in room and is lethargic. No family present at time of visit. Patient falling asleep in conversation.  He is noted to be on RA with respirations WNL. He has not received any pain medication, per chart review.  He is currently receiving IV antibiotics.  [facility] will continue to follow and anticipate any discharge needs.  [facility] medication list and transfer summary placed on chart.  Please call with any questions.  Thanks, Freddi Starr RN, [facility] Liaison [phone]

## 2015-03-06 NOTE — Care Management Note (Signed)
Case Management Note  Patient Details  Name: Manuel Barr MRN: [id number] Date of Birth: 08-17-1945  Subjective/Objective:  70 y/o m admitted w/PNA. From home-active w/[facility].                  Action/Plan:d/c plan return back home w/[facility] services.   Expected Discharge Date:                  Expected Discharge Plan:  Home w Hospice Care  In-House Referral:     Discharge planning Services  CM Consult  Post Acute Care Choice:  Hospice ([facility] active.) Choice offered to:     DME Arranged:    DME Agency:     HH Arranged:    HH Agency:     Status of Service:  In process, will continue to follow  Medicare Important Message Given:    Date Medicare IM Given:    Medicare IM give by:    Date Additional Medicare IM Given:    Additional Medicare Important Message give by:     If discussed at [place] of Stay Meetings, dates discussed:    Additional Comments:  Dessa Phi, RN 03/06/2015, 4:00 PM

## 2015-03-07 ENCOUNTER — Inpatient Hospital Stay (HOSPITAL_COMMUNITY)

## 2015-03-07 ENCOUNTER — Encounter (HOSPITAL_COMMUNITY): Payer: Self-pay | Admitting: Radiology

## 2015-03-07 DIAGNOSIS — E43 Unspecified severe protein-calorie malnutrition: Secondary | ICD-10-CM | POA: Diagnosis present

## 2015-03-07 LAB — TYPE AND SCREEN
ABO/RH(D): O POS
Antibody Screen: NEGATIVE
Unit division: 0
Unit division: 0

## 2015-03-07 LAB — CREATININE, SERUM
Creatinine, Ser: 1.15 mg/dL (ref 0.61–1.24)
GFR calc Af Amer: >60 mL/min (ref >60)

## 2015-03-07 LAB — GLUCOSE, CAPILLARY
GLUCOSE-CAPILLARY: 78 mg/dL (ref 65–99)
GLUCOSE-CAPILLARY: 166 mg/dL — AB (ref 65–99)
GLUCOSE-CAPILLARY: 170 mg/dL — AB (ref 65–99)
Glucose-Capillary: 183 mg/dL — ABNORMAL HIGH (ref 65–99)

## 2015-03-07 LAB — VANCOMYCIN, TROUGH: Vancomycin Tr: 20 ug/mL (ref 10.0–20.0)

## 2015-03-07 MED ORDER — MIDAZOLAM HCL 2 MG/2ML IJ SOLN
INTRAMUSCULAR | Status: AC
  Filled 2015-03-07: qty 6

## 2015-03-07 MED ORDER — FENTANYL CITRATE (PF) 100 MCG/2ML IJ SOLN
INTRAMUSCULAR | Status: AC | PRN
  Administered 2015-03-07: 50 ug via INTRAVENOUS

## 2015-03-07 MED ORDER — LIDOCAINE HCL 1 % IJ SOLN
INTRAMUSCULAR | Status: AC
  Filled 2015-03-07: qty 20

## 2015-03-07 MED ORDER — MIDAZOLAM HCL 2 MG/2ML IJ SOLN
INTRAMUSCULAR | Status: AC | PRN
  Administered 2015-03-07 (×2): 1 mg via INTRAVENOUS
  Administered 2015-03-07: 0.5 mg via INTRAVENOUS

## 2015-03-07 MED ORDER — IOHEXOL 300 MG/ML  SOLN
10.0000 mL | Freq: Once | INTRAMUSCULAR | Status: AC | PRN
  Administered 2015-03-07: 5 mL via INTRAVENOUS

## 2015-03-07 MED ORDER — FENTANYL CITRATE (PF) 100 MCG/2ML IJ SOLN
INTRAMUSCULAR | Status: AC
  Filled 2015-03-07: qty 4

## 2015-03-07 MED ORDER — IOHEXOL 300 MG/ML  SOLN
24.0000 mL | Freq: Once | INTRAMUSCULAR | Status: AC | PRN
  Administered 2015-03-07: 24 mL via INTRAVENOUS

## 2015-03-07 NOTE — Progress Notes (Signed)
[facility] PROGRESS NOTE  Manuel Barr [id number] DOB: 08-Mar-1946 DOA: 03/05/2015 PCP: Shiela Mayer, PA  Brief narrative: 69 y/o with history of duodenal cancer, pulmonary embolism on anticoagulation who presented to the hospital with profound anemia and symptoms related to anemia. Chest x ray reported finding suspicious for pna as such patient placed on antibiotics and transfused. Hematologist consulted for case given recent history of PE and anemia requiring transfusion.  Assessment/Plan: Principal Problem:   PNA (pneumonia) - Not entirely clear whether x-ray findings are secondary to old infection at the beginning of this month versus PE. Patient does not seem like he has active infection in that WBC count within normal limits and he is afebrile. Nonetheless at this juncture will cover with cefepime and vancomycin.  Active Problems:   Diabetes mellitus, type 2 (HCC) - Continue SSI and Lantus. 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 Will consult IR    Anemia - Will place order for stool guaiacs - Repeat hemoglobin after transfusion 9.1  Code Status: full Family Communication: d/c patient. Disposition Plan: pending recommendations from specialist   Consultants:  Hematology  IR  Procedures:  none  Antibiotics:  Cefepime and vancomycin  HPI/Subjective: Pt has no new complaints today. No acute issues overnight reported.  Objective: Filed Vitals:   03/07/15 0612 03/07/15 1300  BP: 113/70 110/76  Pulse: 104 107  Temp: 97 F (36.1 C) 98 F (36.7 C)  Resp: 20 20    Intake/Output Summary (Last 24 hours) at 03/07/15 1416 Last data filed at 03/07/15 1016  Gross per 24 hour  Intake    680 ml  Output    700 ml  Net    -20 ml    Filed Weights   03/05/15 1903  Weight: 68 kg (149 lb 14.6 oz)    Exam:   General:  Pt in nad, alert and awake  Cardiovascular: rrr, no mrg  Respiratory: cta bl, no wheezes, equal chest rise.  Abdomen: soft, ND, no rebound tenderness, no guarding  Musculoskeletal: No cyanosis or clubbing   Data Reviewed: Basic Metabolic Panel:  Recent Labs Lab 03/05/15 1530 03/07/15 0405  NA 137  --   K 4.7  --   CL 105  --   CO2 23  --   GLUCOSE 272*  --   BUN 23*  --   CREATININE 1.33* 1.15  CALCIUM 7.8*  --    Liver Function Tests:  Recent Labs Lab 03/05/15 1530  AST 25  ALT 14*  ALKPHOS 415*  BILITOT 1.0  PROT 5.0*  ALBUMIN 1.7*   No results for input(s): LIPASE, AMYLASE in the last 168 hours. No results for input(s): AMMONIA in the last 168 hours. CBC:  Recent Labs Lab 03/05/15 1530 03/06/15 1057  WBC 8.4 9.9  NEUTROABS 7.8*  --   HGB 6.1* 9.1*  HCT 19.2* 27.4*  MCV 89.7 86.2  PLT 196 179   Cardiac Enzymes: No results for input(s): CKTOTAL, CKMB, CKMBINDEX, TROPONINI in the last 168 hours. BNP (last 3 results)  Recent Labs  03/05/15 1530  BNP 113.2*    ProBNP (last 3 results) No results for input(s): PROBNP in the last 8760 hours.  CBG:  Recent Labs Lab 03/06/15 1143 03/06/15 1652 03/06/15 2125 03/07/15 0740 03/07/15 1140  GLUCAP 210* 135* 152*  78 166*    Recent Results (from the past 240 hour(s))  Culture, blood (routine x 2) Call MD if unable to obtain prior to antibiotics being given     Status: None (Preliminary result)   Collection Time: 03/05/15 10:45 PM  Result Value Ref Range Status   Specimen Description BLOOD LEFT ARM  Final   Special Requests BOTTLES DRAWN AEROBIC ONLY 5CC  Final   Culture   Final    NO GROWTH 1 DAY Performed at [facility]    Report Status PENDING  Incomplete  Culture, blood (routine x 2) Call MD if unable to obtain prior to antibiotics being given     Status: None (Preliminary result)    Collection Time: 03/05/15 10:50 PM  Result Value Ref Range Status   Specimen Description BLOOD RIGHT ARM  Final   Special Requests BOTTLES DRAWN AEROBIC AND ANAEROBIC 5CC  Final   Culture   Final    NO GROWTH 1 DAY Performed at [facility]    Report Status PENDING  Incomplete     Studies: Dg Chest 2 View  03/05/2015  CLINICAL DATA:  Worsening shortness of breath for 2 days. History of duodenum and liver cancer. Pleural effusions. EXAM: CHEST  2 VIEW COMPARISON:  None available FINDINGS: Right IJ port catheter tip SVC RA junction. Extensive consolidative airspace process throughout the left lung with central air bronchograms. Underlying left lung mass not excluded. Blunting of the left costophrenic angle compatible with a left effusion. No pneumothorax. Right lung remains relatively clear. Small right midline nodule appears calcified, suspicious for granulomatous disease. No right effusion. Trachea is midline. Biliary and duodenal stents noted. No acute osseous finding. IMPRESSION: Extensive consolidative airspace process throughout the left lung compatible with underlying known lung mass and likely superimposed left lung pneumonia/pneumonitis. Associated small to moderate left effusion Other chronic and postoperative findings as above. Electronically Signed   By: Jerilynn Mages.  Shick M.D.   On: 03/05/2015 15:06    Scheduled Meds: . ceFEPime (MAXIPIME) IV  1 g Intravenous Q12H  . feeding supplement (GLUCERNA SHAKE)  237 mL Oral TID BM  . ferrous sulfate  325 mg Oral BID WC  . insulin aspart  0-15 Units Subcutaneous TID WC  . insulin aspart  0-5 Units Subcutaneous QHS  . insulin glargine  5 Units Subcutaneous QHS  . sodium chloride  3 mL Intravenous Q12H  . vancomycin  750 mg Intravenous Q12H   Continuous Infusions:   Time spent: > 35 minutes    Velvet Bathe  [facility] Pager [phone] If 7PM-7AM, please contact night-coverage at [email or web address], password [facility] 03/07/2015, 2:16 PM   LOS: 1 day

## 2015-03-07 NOTE — Progress Notes (Signed)
PT Cancellation Note  Patient Details Name: Manuel Barr MRN: [id number] DOB: 1945-09-04   Cancelled Treatment:    Reason Eval/Treat Not Completed: Other (comment) (pending possible IVC filter today)   Bryanna Yim,KATHrine E 03/07/2015, 10:13 AM Carmelia Bake, PT, DPT 03/07/2015 Pager: [phone]

## 2015-03-07 NOTE — Procedures (Signed)
Interventional Radiology Procedure Note  Procedure:  IVC Filter Placement  Complications:  None  Estimated Blood Loss: < 10 mL  Widely patent IVC.  Low main vs accessory right renal vein. Bard Denali IVC filter positioned just below right renal vein orifice.  Venetia Night. Kathlene Cote, M.D Pager:  [phone]

## 2015-03-07 NOTE — Progress Notes (Signed)
[facility] Work note Patient is currently receiving hospice care at  Home. He lives with his wife who is primary caregiver. She was not in the room during visit, yet husband reports that she is to arrive this am. He shared concerns with ability and need for physical therapy. He indicated that he is interested in inpatient as he worries about his ability to manage at home. He shared [facility]. LCSW reviewed role of hospice and comfort measures vs rehabilitation. He shared a desire to "try" and "not lay here and throw in the towel". We briefly discussed plans, yet patient has not talked to wife about planning and cannot fully commit to decision of returning home or going to facility at time of discharge. LCSW to follow up with wife, hospital SW.  Support, education offered and ongoing follow up. Katherina Right, [place]

## 2015-03-07 NOTE — Progress Notes (Addendum)
Patient returned from IR, pt appears to be drowsy but is easy to arouse.Pt remains A&Ox4. Will continue to monitor

## 2015-03-07 NOTE — Progress Notes (Signed)
Inpatient [facility]-Room 1408-[facility] RN Visit  This is a related admission to [facility] diagnosis of Cancer of Duodenum. Patient is a Full Code. Patient seen in room and is resting. No family present at time of visit. Patient voiced interest in pursuing PT upon discharge. He stated he feels like PT will be beneficial in helping him get more mobile in his home.  Explained that I will update [facility] social worker to follow up on patient wishes. [facility] social worker, Threasa Beards made aware and plans to follow up today. He is noted to be on RA with respirations WNL. He has not received any pain medication, per chart review. He is currently receiving IV antibiotics. [facility] will continue to follow and anticipate any discharge needs.  Please call with any questions.  Thanks, Freddi Starr RN, [place] [facility] Liaison [phone]

## 2015-03-07 NOTE — H&P (Signed)
Chief Complaint: Patient was seen in consultation today for recent pulmonary embolism with now GI bleeding  Chief Complaint  Patient presents with  . Shortness of Breath   at the request of TRH/Oncology   Referring Physician(s): TRH/Oncology   History of Present Illness: Manuel Barr is a 69 y.o. male with metastatic ampullary carcinoma, recent pulmonary embolism within last month per patient and was on anticoagulation. Patient now with GI bleeding and IR received request for image guided inferior vena cava filter placement. He denies any chest pain, shortness of breath or palpitations. The patient does have sleep apnea, but does not use a CPAP. He denies any chronic oxygen use. He has previously tolerated sedation without complications.    Past Medical History  Diagnosis Date  . Type II diabetes mellitus ([place])   . Hyperlipemia     [facility] 11/07/2014  . Hypertension     [facility] 11/07/2014  . Kidney stones   . 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 Liver cancer ([place]) dx'd 08/2014  . Macular degeneration, bilateral   . GERD (gastroesophageal reflux disease)   . Sleep apnea     "suppose to use a mask but I haven't in awhile" (11/07/2014)    Past Surgical History  Procedure Laterality Date  . Abdominal exploration surgery  08/2014    attempted whipple per wife -but unable to resect /notes 11/07/2014  . Bile duct stent placement  09/2014    [facility] 11/07/2014  . Cholecystectomy open  08/2014    Allergies: Codeine and Other  Medications: Prior to Admission medications   Medication Sig Start Date End Date Taking? Authorizing Provider  cyclobenzaprine (FLEXERIL) 10 MG tablet Take 5 mg by mouth at bedtime as needed. Muscle spasms   Yes Historical Provider, MD  enoxaparin (LOVENOX) 80 MG/0.8ML injection Inject 70 mg into the skin 2 (two) times daily. Inject in Left side in the morning, and inject in Right side at night 02/09/15 08/10/15 Yes Historical  Provider, MD  fluticasone (FLONASE) 50 MCG/ACT nasal spray Place 1 spray into both nostrils daily as needed for allergies or rhinitis.    Yes Historical Provider, MD  Insulin Glargine (LANTUS SOLOSTAR) 100 UNIT/ML Solostar Pen Inject 5-10 Units into the skin daily as needed. For high blood sugar level.  If around 200 take 5 units, if above 300 take 8-10 units   Yes Historical Provider, MD  ondansetron (ZOFRAN) 8 MG tablet Take 8 mg by mouth every 8 (eight) hours as needed for nausea or vomiting.    Yes Historical Provider, MD  oxycodone (OXY-IR) 5 MG capsule Take 5 mg by mouth every 4 (four) hours as needed for pain.   Yes Historical Provider, MD  pantoprazole (PROTONIX) 40 MG tablet Take 40 mg by mouth daily as needed (heartburn).    Yes Historical Provider, MD  predniSONE (DELTASONE) 20 MG tablet Take 20 mg by mouth daily with breakfast.   Yes Historical Provider, MD  PRESCRIPTION MEDICATION Chemo at [facility]   Yes Historical Provider, MD  aspirin EC 81 MG EC tablet Take 1 tablet (81 mg total) by mouth daily. Patient not taking: Reported on 03/05/2015 11/08/14   Ejiroghene Arlyce Dice, MD  metFORMIN (GLUCOPHAGE-XR) 500 MG 24 hr tablet Take 500-1,000 mg by mouth 2 (two) times daily as needed (high blood sugar). Reported on 03/05/2015    Historical Provider, MD     Family History  Problem Relation Age of Onset  .  Diabetes Mother   . Diabetes Father   . Heart disease Father   . Emphysema Father     Social History   Social History  . Marital Status: Married    Spouse Name: N/A  . Number of Children: N/A  . Years of Education: N/A   Social History Main Topics  . Smoking status: Never Smoker   . Smokeless tobacco: Never Used  . Alcohol Use: No  . Drug Use: No  . Sexual Activity: Not Currently   Other Topics Concern  . None   Social History Narrative    Review of Systems: A 12 point ROS discussed and pertinent positives are indicated in the HPI above.  All other systems are  negative.  Review of Systems  Vital Signs: BP 110/76 mmHg  Pulse 107  Temp(Src) 98 F (36.7 C) (Oral)  Resp 20  Ht 5\' 6"  (1.676 m)  Wt 149 lb 14.6 oz (68 kg)  BMI 24.21 kg/m2  SpO2 97%  Physical Exam  Constitutional: He is oriented to person, place, and time.  HENT:  Head: Normocephalic and atraumatic.  Cardiovascular: Regular rhythm.  Exam reveals no gallop and no friction rub.   No murmur heard. Tachycardic  Pulmonary/Chest: Effort normal and breath sounds normal. No respiratory distress. He has no wheezes. He has no rales.  Abdominal: Soft. Bowel sounds are normal. He exhibits no distension. There is no tenderness.  Neurological: He is alert and oriented to person, place, and time.    Mallampati Score:  MD Evaluation Airway: WNL Heart: WNL Abdomen: WNL Chest/ Lungs: WNL ASA  Classification: 3 Mallampati/Airway Score: Two  Imaging: Dg Chest 2 View  03/05/2015  CLINICAL DATA:  Worsening shortness of breath for 2 days. History of duodenum and liver cancer. Pleural effusions. EXAM: CHEST  2 VIEW COMPARISON:  None available FINDINGS: Right IJ port catheter tip SVC RA junction. Extensive consolidative airspace process throughout the left lung with central air bronchograms. Underlying left lung mass not excluded. Blunting of the left costophrenic angle compatible with a left effusion. No pneumothorax. Right lung remains relatively clear. Small right midline nodule appears calcified, suspicious for granulomatous disease. No right effusion. Trachea is midline. Biliary and duodenal stents noted. No acute osseous finding. IMPRESSION: Extensive consolidative airspace process throughout the left lung compatible with underlying known lung mass and likely superimposed left lung pneumonia/pneumonitis. Associated small to moderate left effusion Other chronic and postoperative findings as above. Electronically Signed   By: Jerilynn Mages.  Shick M.D.   On: 03/05/2015 15:06    Labs:  CBC:  Recent  Labs  11/07/14 1740 11/07/14 1747 11/08/14 0500 03/05/15 1530 03/06/15 1057  WBC 5.1  --  3.9* 8.4 9.9  HGB 10.7* 11.2* 9.2* 6.1* 9.1*  HCT 32.0* 33.0* 27.6* 19.2* 27.4*  PLT 176  --  156 196 179    COAGS:  Recent Labs  11/07/14 1740  INR 1.10  APTT 27    BMP:  Recent Labs  11/07/14 1740 11/07/14 1747 11/08/14 0500 03/05/15 1530 03/07/15 0405  NA 143 142 141 137  --   K 3.2* 3.1* 4.1 4.7  --   CL 108 104 113* 105  --   CO2 24  --  23 23  --   GLUCOSE 91 86 113* 272*  --   BUN 12 13 9  23*  --   CALCIUM 8.8*  --  7.7* 7.8*  --   CREATININE 1.21 1.00 1.02 1.33* 1.15  GFRNONAA >60  --  >  60 54* >60  GFRAA >60  --  >60 >60 >60    LIVER FUNCTION TESTS:  Recent Labs  11/07/14 1740 11/08/14 0500 03/05/15 1530  BILITOT 1.3* 1.1 1.0  AST 35 21 25  ALT 25 21 14*  ALKPHOS 128* 106 415*  PROT 6.2* 4.9* 5.0*  ALBUMIN 3.0* 2.3* 1.7*    Assessment and Plan: Metastatic ampullary carcinoma  Pulmonary embolism within last month per patient  GI bleeding  Request for image guided inferior vena cava filter placement, unable to be on anticoagulation secondary to bleeding  The patient has been NPO, no blood thinners taken, labs and vitals have been reviewed. Risks and Benefits discussed with the patient including, but not limited to bleeding, infection, contrast induced renal failure, filter fracture or migration which can lead to emergency surgery or even death, strut penetration with damage or irritation to adjacent structures and caval thrombosis. All of the patient's questions were answered, patient is agreeable to proceed. Consent signed and in chart.    Thank you for this interesting consult.  I greatly enjoyed meeting Manuel Barr and look forward to participating in their care.  A copy of this report was sent to the requesting provider on this date.  SignedHedy Jacob 03/07/2015, 2:05 PM   I spent a total of 20 Minutes in face to face in clinical  consultation, greater than 50% of which was counseling/coordinating care for recent pulmonary embolism and GI bleeding.

## 2015-03-07 NOTE — Progress Notes (Signed)
Patient has increased edema in LUE, provider contacted. Will ensure pt is turned every 2 hours and will elevate LUE. No additional orders provided. Will continue to monitor.

## 2015-03-07 NOTE — Progress Notes (Signed)
Pharmacy Antibiotic Follow-up Note  Manuel Barr is a 69 y.o. year-old male admitted on 03/05/2015.  The patient is currently on day #3 of Vancomycin 750 mg IV q12h and Cefepime 1g IV q12h for pneumonia.  Assessment/Plan: This patient's current antibiotics will be continued without adjustments.   Temp (24hrs), Avg:97.6 F (36.4 C), Min:97 F (36.1 C), Max:98.2 F (36.8 C)   Recent Labs Lab 03/05/15 1530 03/06/15 1057  WBC 8.4 9.9    Recent Labs Lab 03/05/15 1530 03/07/15 0405  CREATININE 1.33* 1.15   Estimated Creatinine Clearance: 56.2 mL/min (by C-G formula based on Cr of 1.15).    Allergies  Allergen Reactions  . Codeine Nausea And Vomiting  . Other     Pt is on a Mechanical Soft Diet, has a stint in stomach.    Antimicrobials this admission: 12/25 >> Cefepime >> 12/25 >> Vanc >>   Levels/dose changes this admission: 12/6 Previous admission at [facility]:  Vanc trough 20.2 (12/6) on Vanc 1250 q12h with SCr ~ 0.65.   12/27 0730 VT: 20 mcg/ml on 750 mg IV q12h with previous dose given 1 hour late, SCr 1.15. Goal VT 15-20 mcg/ml.  Microbiology Results: 12/25 BCx: ngtd x 1 day 12/25 Sputum: ordered, not collected  Thank you for allowing pharmacy to be a part of this patient's care.  Hershal Coria PharmD 03/07/2015 10:17 AM

## 2015-03-08 ENCOUNTER — Inpatient Hospital Stay (HOSPITAL_COMMUNITY)

## 2015-03-08 DIAGNOSIS — R609 Edema, unspecified: Secondary | ICD-10-CM

## 2015-03-08 DIAGNOSIS — E43 Unspecified severe protein-calorie malnutrition: Secondary | ICD-10-CM

## 2015-03-08 DIAGNOSIS — J189 Pneumonia, unspecified organism: Principal | ICD-10-CM

## 2015-03-08 DIAGNOSIS — D500 Iron deficiency anemia secondary to blood loss (chronic): Secondary | ICD-10-CM | POA: Diagnosis present

## 2015-03-08 DIAGNOSIS — D649 Anemia, unspecified: Secondary | ICD-10-CM

## 2015-03-08 DIAGNOSIS — C170 Malignant neoplasm of duodenum: Secondary | ICD-10-CM

## 2015-03-08 DIAGNOSIS — E1129 Type 2 diabetes mellitus with other diabetic kidney complication: Secondary | ICD-10-CM

## 2015-03-08 LAB — CBC
HEMATOCRIT: 27.9 % — AB (ref 39.0–52.0)
HEMOGLOBIN: 9.2 g/dL — AB (ref 13.0–17.0)
MCH: 28.1 pg (ref 26.0–34.0)
MCHC: 33 g/dL (ref 30.0–36.0)
MCV: 85.3 fL (ref 78.0–100.0)
Platelets: 142 10*3/uL — ABNORMAL LOW (ref 150–400)
RBC: 3.27 MIL/uL — ABNORMAL LOW (ref 4.22–5.81)
RDW: 17.7 % — AB (ref 11.5–15.5)
WBC: 8.5 10*3/uL (ref 4.0–10.5)

## 2015-03-08 LAB — BASIC METABOLIC PANEL
Anion gap: 6 (ref 5–15)
BUN: 19 mg/dL (ref 6–20)
CALCIUM: 7.6 mg/dL — AB (ref 8.9–10.3)
CHLORIDE: 105 mmol/L (ref 101–111)
CO2: 25 mmol/L (ref 22–32)
CREATININE: 1.13 mg/dL (ref 0.61–1.24)
GFR calc Af Amer: >60 mL/min (ref >60)
GFR calc non Af Amer: >60 mL/min (ref >60)
GLUCOSE: 169 mg/dL — AB (ref 65–99)
Potassium: 3.8 mmol/L (ref 3.5–5.1)
Sodium: 136 mmol/L (ref 135–145)

## 2015-03-08 LAB — GLUCOSE, CAPILLARY
GLUCOSE-CAPILLARY: 128 mg/dL — AB (ref 65–99)
Glucose-Capillary: 151 mg/dL — ABNORMAL HIGH (ref 65–99)
Glucose-Capillary: 96 mg/dL (ref 65–99)
Glucose-Capillary: 122 mg/dL — ABNORMAL HIGH (ref 65–99)

## 2015-03-08 MED ORDER — SODIUM CHLORIDE 0.9 % IV SOLN
510.0000 mg | Freq: Once | INTRAVENOUS | Status: AC
  Administered 2015-03-08: 510 mg via INTRAVENOUS
  Filled 2015-03-08: qty 17

## 2015-03-08 NOTE — Progress Notes (Signed)
Inpatient [facility]-Room 1408-[facility] [place] RN Visit  This is a related admission to [facility] diagnosis of Cancer of Duodenum. Patient is a Full Code. Patient seen in room and is resting. No family present at time of visit. Patient voiced concern of going back home with hospice.  He feels like he needs to be in a facility to work on rehabilitation for strength. He is currently receiving IV antibiotics. [facility] will continue to assess.  [facility] social worker, Threasa Beards updated on patient's wishes at this time.  Please call with any questions.  Thanks, Freddi Starr RN, [facility] Liaison [phone]

## 2015-03-08 NOTE — Progress Notes (Signed)
VASCULAR LAB PRELIMINARY  PRELIMINARY  PRELIMINARY  PRELIMINARY  Left upper extremity venous duplex completed.    Preliminary report:  Right:  Mobile, non occlusive DVT noted in the jugular vein.  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       Rudransh Bellanca, RVT 03/08/2015, 3:00 PM

## 2015-03-08 NOTE — Progress Notes (Signed)
Progress Note   Manuel Barr [id number] DOB: 18-Feb-1946 DOA: 03/05/2015 PCP: Shiela Mayer, PA   Brief Narrative:   Manuel Barr is an 69 y.o. male with a PMH of duodenal carcinoma/liver metastasis, recent hospitalization for treatment of pulmonary embolism and pneumonia, discharged on Lovenox who was admitted 03/05/15 chief complaint of increased weakness and dyspnea.  Assessment/Plan:   Principal Problem:   Healthcare associated PNA (pneumonia) - Chest x-ray consistent with ? postobstructive pneumonia/pneumonitis given known lung mass.  - Given history of recent hospitalization and known malignancy, patient was covered with cefepime and vancomycin. - He is afebrile with no leukocytosis. Blood cultures are negative to date. Discontinue antibiotics. - Drain Pleurx catheter every other day.  Active Problems:   Acute kidney injury - Creatinine improved with hydration.    Diabetes mellitus, type 2 ([place]) - Currently being managed with moderate scale SSI and 5 units of Lantus daily. - CBGs 78-187.    Metastatic Duodenal cancer [facility]) - Patient is under hospice care, but has remained a full code, which will need to be readdressed. - Dr. Marin Olp following, feels prognosis is poor.    Pulmonary embolism ([place]) - IR consulted for placement of an IVC filter, which was placed 03/07/15. - LUE  Swollen, check U/S.    Acute blood loss anemia/iron deficiency anemia - Continue iron supplementation. Anticoagulants discontinued. - Hemoglobin 6.1 on admission, status post 2 units of PRBCs 03/05/15 with an appropriate rise in hemoglobin.    Protein-calorie malnutrition, severe - Dietitian consulted. Continue Glucerna supplements. Liberalize diet.    DVT Prophylaxis - Lovenox discontinued secondary to anemia. Currently has SCDs on.   Family Communication/Anticipated D/C date and plan/Code Status   Family Communication: Seth Bake and mother in law at the bedside. Disposition Plan:  SNF for rehab when stable. Lives with wife, Seth Bake. Anticipated D/C date:   03/09/15 to SNF if bed available and patient otherwise stable. Code Status: Full code.   IV Access:    Peripheral IV   Procedures and diagnostic studies:   Dg Chest 2 View  03/05/2015  CLINICAL DATA:  Worsening shortness of breath for 2 days. History of duodenum and liver cancer. Pleural effusions. EXAM: CHEST  2 VIEW COMPARISON:  None available FINDINGS: Right IJ port catheter tip SVC RA junction. Extensive consolidative airspace process throughout the left lung with central air bronchograms. Underlying left lung mass not excluded. Blunting of the left costophrenic angle compatible with a left effusion. No pneumothorax. Right lung remains relatively clear. Small right midline nodule appears calcified, suspicious for granulomatous disease. No right effusion. Trachea is midline. Biliary and duodenal stents noted. No acute osseous finding. IMPRESSION: Extensive consolidative airspace process throughout the left lung compatible with underlying known lung mass and likely superimposed left lung pneumonia/pneumonitis. Associated small to moderate left effusion Other chronic and postoperative findings as above. Electronically Signed   By: Jerilynn Mages.  Shick M.D.   On: 03/05/2015 15:06     Medical Consultants:    Oncology: Dr. Marin Olp  Anti-Infectives:    Cefepime 03/05/15---> 03/08/15  Vancomycin 03/05/15---> 03/08/15   Subjective:   Manuel Barr still is a bit short of breath but no complaints of cough.  Still reports fatigue.  Has left sided achy back pain/stiffness. Last BM was 2 days ago.  Passing flatus.  No N/V.  Appetite fair.  Objective:    Filed Vitals:   03/07/15 1700 03/07/15 1800 03/07/15 2109 03/08/15 0534  BP: 120/74 112/64 111/67 123/79  Pulse: 98  98 112 101  Temp: 97.7 F (36.5 C) 97.7 F (36.5 C) 98.7 F (37.1 C) 98.5 F (36.9 C)  TempSrc: Oral Oral Oral Oral  Resp: 16 16 16 16   Height:       Weight:      SpO2: 98% 97% 96% 97%    Intake/Output Summary (Last 24 hours) at 03/08/15 0739 Last data filed at 03/08/15 [id number]  Gross per 24 hour  Intake    860 ml  Output    650 ml  Net    210 ml   Filed Weights   03/05/15 1903  Weight: 68 kg (149 lb 14.6 oz)    Exam: Gen:  NAD Cardiovascular:  Tachycardic, No M/R/G Respiratory:  Lungs diminished Gastrointestinal:  Abdomen soft, NT/ND, + BS Extremities:  LUS swollen. LEs with SCDs on, 1+ edema   Data Reviewed:    Labs: Basic Metabolic Panel:  Recent Labs Lab 03/05/15 1530 03/07/15 0405 03/08/15 0431  NA 137  --  136  K 4.7  --  3.8  CL 105  --  105  CO2 23  --  25  GLUCOSE 272*  --  169*  BUN 23*  --  19  CREATININE 1.33* 1.15 1.13  CALCIUM 7.8*  --  7.6*   GFR Estimated Creatinine Clearance: 57.2 mL/min (by C-G formula based on Cr of 1.13). Liver Function Tests:  Recent Labs Lab 03/05/15 1530  AST 25  ALT 14*  ALKPHOS 415*  BILITOT 1.0  PROT 5.0*  ALBUMIN 1.7*   CBC:  Recent Labs Lab 03/05/15 1530 03/06/15 1057 03/08/15 0431  WBC 8.4 9.9 8.5  NEUTROABS 7.8*  --   --   HGB 6.1* 9.1* 9.2*  HCT 19.2* 27.4* 27.9*  MCV 89.7 86.2 85.3  PLT 196 179 142*   CBG:  Recent Labs Lab 03/06/15 2125 03/07/15 0740 03/07/15 1140 03/07/15 1659 03/07/15 2113  GLUCAP 152* 78 166* 170* 183*   Anemia work up:  Recent Labs  03/06/15 1301  FERRITIN 736*  TIBC 122*  IRON 14*  RETICCTPCT 3.6*   Sepsis Labs:  Recent Labs Lab 03/05/15 1530 03/06/15 1057 03/08/15 0431  WBC 8.4 9.9 8.5   Microbiology Recent Results (from the past 240 hour(s))  Culture, blood (routine x 2) Call MD if unable to obtain prior to antibiotics being given     Status: None (Preliminary result)   Collection Time: 03/05/15 10:45 PM  Result Value Ref Range Status   Specimen Description BLOOD LEFT ARM  Final   Special Requests BOTTLES DRAWN AEROBIC ONLY 5CC  Final   Culture   Final    NO GROWTH 1 DAY Performed  at [facility]    Report Status PENDING  Incomplete  Culture, blood (routine x 2) Call MD if unable to obtain prior to antibiotics being given     Status: None (Preliminary result)   Collection Time: 03/05/15 10:50 PM  Result Value Ref Range Status   Specimen Description BLOOD RIGHT ARM  Final   Special Requests BOTTLES DRAWN AEROBIC AND ANAEROBIC 5CC  Final   Culture   Final    NO GROWTH 1 DAY Performed at [facility]    Report Status PENDING  Incomplete     Medications:   . ceFEPime (MAXIPIME) IV  1 g Intravenous Q12H  . feeding supplement (GLUCERNA SHAKE)  237 mL Oral TID BM  . ferrous sulfate  325 mg Oral BID WC  . insulin aspart  0-15 Units  Subcutaneous TID WC  . insulin aspart  0-5 Units Subcutaneous QHS  . insulin glargine  5 Units Subcutaneous QHS  . sodium chloride  3 mL Intravenous Q12H  . vancomycin  750 mg Intravenous Q12H   Continuous Infusions:   Time spent: 25 minutes.   LOS: 2 days   RAMA,CHRISTINA  [facility] Pager [phone]. If unable to reach me by pager, please call my cell phone at [phone].  *Please refer to [email or web address], password [email or web address] to get updated schedule on who will round on this patient, as hospitalists switch teams weekly. If 7PM-7AM, please contact night-coverage at [email or web address], password [email or web address] for any overnight needs.  03/08/2015, 7:39 AM

## 2015-03-08 NOTE — Progress Notes (Signed)
Pharmacy Antibiotic Follow-up Note  Manuel Barr is a 69 y.o. year-old male admitted on 03/05/2015.  The patient is currently on day 4 of Vancomycin and Cefepime for pneumonia.  Assessment/Plan: After discussion with Dr. Rockne Menghini, vancomycin and cefepime will be discontinued because treatment is no longer indicated.  Per Dr. Antonieta Pert note, consolidation in left lung is more likely malignancy; he does not think it is pneumonia.  Temp (24hrs), Avg:98 F (36.7 C), Min:97.6 F (36.4 C), Max:98.7 F (37.1 C)   Recent Labs Lab 03/05/15 1530 03/06/15 1057 03/08/15 0431  WBC 8.4 9.9 8.5    Recent Labs Lab 03/05/15 1530 03/07/15 0405 03/08/15 0431  CREATININE 1.33* 1.15 1.13   Estimated Creatinine Clearance: 57.2 mL/min (by C-G formula based on Cr of 1.13).    Allergies  Allergen Reactions  . Codeine Nausea And Vomiting  . Other     Pt is on a Mechanical Soft Diet, has a stint in stomach.    Antimicrobials this admission: 12/25 >> Cefepime >> 12/28 12/25 >> Vanc >> 12/28  Levels/dose changes this admission: 12/27 0730 VT: 20 on 750 mg IV q12h with previous dose given 1 hour late   Microbiology results: 12/25 BCx: ngtd x 1 day 12/25 Sputum: ordered, not collected  Thank you for allowing pharmacy to be a part of this patient's care.  Gretta Arab PharmD, BCPS Pager [phone] 03/08/2015 12:36 PM

## 2015-03-08 NOTE — Care Management Note (Signed)
Case Management Note  Patient Details  Name: FAYETTE FIESER MRN: [id number] Date of Birth: 1946/03/09  Subjective/Objective:  Spoke to [facility] now wants SNF. [facility] will follow w/revocation papers to sign.CSW following for SNF.                  Action/Plan:d/c plan SNF.   Expected Discharge Date:                  Expected Discharge Plan:  Skilled Nursing Facility  In-House Referral:  Clinical Social Work  Discharge planning Services  CM Consult  Post Acute Care Choice:  Hospice ([facility] active.) Choice offered to:     DME Arranged:    DME Agency:     HH Arranged:    [place] Agency:     Status of Service:  In process, will continue to follow  Medicare Important Message Given:    Date Medicare IM Given:    Medicare IM give by:    Date Additional Medicare IM Given:    Additional Medicare Important Message give by:     If discussed at [place] of Stay Meetings, dates discussed:    Additional Comments:  Dessa Phi, RN 03/08/2015, 12:06 PM

## 2015-03-08 NOTE — NC FL2 (Signed)
[place] LEVEL OF CARE SCREENING TOOL     IDENTIFICATION  Patient Name: Manuel Barr Birthdate: [birth year] Sex: male Admission Date (Current Location): 03/05/2015  [facility] and [place] Number:  Herbalist and Address:  [facility],  [place] [address] [place]      Provider Number: [id number]  Attending Physician Name and Address:  Venetia Maxon Rama, MD  Relative Name and Phone Number:       Current Level of Care: Hospital Recommended Level of Care: [place] Prior Approval Number:    Date Approved/Denied:   PASRR Number:  ([id number])  Discharge Plan: SNF    Current Diagnoses: Patient Active Problem List   Diagnosis Date Noted  . Protein-calorie malnutrition, severe 03/07/2015  . PNA (pneumonia) 03/05/2015  . Pulmonary embolism ([place]) 03/05/2015  . Anemia 03/05/2015  . Duodenal cancer ([place]) 11/08/2014  . HLD (hyperlipidemia) 11/08/2014  . HTN (hypertension) 11/08/2014  . TIA (transient ischemic attack)   . Dysarthria 11/07/2014  . Slurred speech 11/07/2014  . Diabetes mellitus, type 2 ([place]) 10/26/2012    Orientation RESPIRATION BLADDER Height & Weight    Self, Time, Situation, Place  Normal Continent 5\' 6"  (167.6 cm) 149 lbs.  BEHAVIORAL SYMPTOMS/MOOD NEUROLOGICAL BOWEL NUTRITION STATUS      Continent Diet (Regular)  AMBULATORY STATUS COMMUNICATION OF NEEDS Skin   Limited Assist Verbally Normal                       Personal Care Assistance Level of Assistance  Bathing, Feeding, Dressing Bathing Assistance: Limited assistance Feeding assistance: Limited assistance Dressing Assistance: Limited assistance     Functional Limitations Info             SPECIAL CARE FACTORS FREQUENCY                       Contractures      Additional Factors Info  Code Status, Allergies Code Status Info:  (Fullcode) Allergies Info:  (Codeine)           Current Medications  (03/08/2015):  This is the current hospital active medication list Current Facility-Administered Medications  Medication Dose Route Frequency Provider Last Rate Last Dose  . 0.9 %  sodium chloride infusion  250 mL Intravenous PRN Velvet Bathe, MD      . ceFEPIme (MAXIPIME) 1 g in dextrose 5 % 50 mL IVPB  1 g Intravenous Q12H Velvet Bathe, MD   1 g at 03/08/15 0744  . cyclobenzaprine (FLEXERIL) tablet 5 mg  5 mg Oral QHS PRN Velvet Bathe, MD      . feeding supplement (GLUCERNA SHAKE) (GLUCERNA SHAKE) liquid 237 mL  237 mL Oral TID BM Maricela Bo [place], RD   237 mL at 03/08/15 0913  . ferrous sulfate tablet 325 mg  325 mg Oral BID WC Velvet Bathe, MD   325 mg at 03/08/15 0744  . fluticasone (FLONASE) 50 MCG/ACT nasal spray 1 spray  1 spray Each Nare Daily PRN Velvet Bathe, MD      . insulin aspart (novoLOG) injection 0-15 Units  0-15 Units Subcutaneous TID WC Velvet Bathe, MD   2 Units at 03/08/15 [phone]  . insulin aspart (novoLOG) injection 0-5 Units  0-5 Units Subcutaneous QHS Velvet Bathe, MD   3 Units at 03/05/15 2247  . insulin glargine (LANTUS) injection 5 Units  5 Units Subcutaneous QHS Velvet Bathe, MD   5 Units at 03/07/15  2323  . ondansetron (ZOFRAN) tablet 8 mg  8 mg Oral Q8H PRN Velvet Bathe, MD      . oxyCODONE (Oxy IR/ROXICODONE) immediate release tablet 5 mg  5 mg Oral Q6H PRN Velvet Bathe, MD   5 mg at 03/08/15 1009  . pantoprazole (PROTONIX) EC tablet 40 mg  40 mg Oral Daily PRN Velvet Bathe, MD      . sodium chloride 0.9 % injection 10-40 mL  10-40 mL Intracatheter PRN Velvet Bathe, MD   10 mL at 03/08/15 0436  . sodium chloride 0.9 % injection 3 mL  3 mL Intravenous Q12H Velvet Bathe, MD   3 mL at 03/05/15 2200  . sodium chloride 0.9 % injection 3 mL  3 mL Intravenous PRN Velvet Bathe, MD      . vancomycin (VANCOCIN) IVPB 750 mg/150 ml premix  750 mg Intravenous Q12H Randa Spike, RPH   750 mg at 03/08/15 [id number]     Discharge Medications: Please see discharge summary for a  list of discharge medications.  Relevant Imaging Results:  Relevant Lab Results:   Additional Information  (SSN: [id number])  Standley Brooking, LCSW

## 2015-03-08 NOTE — Clinical Social Work Placement (Signed)
CSW faxed information out to [facility] SNFs per patient's request. CSW will follow-up with bed offers this afternoon.     Raynaldo Opitz, [facility] Clinical Social Worker cell #: [phone]    CLINICAL SOCIAL WORK PLACEMENT  NOTE  Date:  03/08/2015  Patient Details  Name: Manuel Barr MRN: [id number] Date of Birth: [birth year]  Clinical Social Work is seeking post-discharge placement for this patient at the [place] level of care (*CSW will initial, date and re-position this form in  chart as items are completed):  Yes   Patient/family provided with [place] Work Department's list of facilities offering this level of care within the geographic area requested by the patient (or if unable, by the patient's family).  Yes   Patient/family informed of their freedom to choose among providers that offer the needed level of care, that participate in Medicare, Medicaid or managed care program needed by the patient, have an available bed and are willing to accept the patient.  Yes   Patient/family informed of [facility]'s ownership interest in [facility] and [facility], as well as of the fact that they are under no obligation to receive care at these facilities.  PASRR submitted to EDS on 03/08/15     PASRR number received on 03/08/15     Existing PASRR number confirmed on       FL2 transmitted to all facilities in geographic area requested by pt/family on       FL2 transmitted to all facilities within larger geographic area on       Patient informed that his/her managed care company has contracts with or will negotiate with certain facilities, including the following:            Patient/family informed of bed offers received.  Patient chooses bed at       Physician recommends and patient chooses bed at      Patient to be transferred to   on  .  Patient to be transferred to facility by       Patient  family notified on   of transfer.  Name of family member notified:        PHYSICIAN       Additional Comment:    _______________________________________________ Standley Brooking, LCSW 03/08/2015, 12:41 PM

## 2015-03-08 NOTE — Progress Notes (Signed)
[facility] [place] Work note LCSW received communication from hospital liaison RN, Lanetta Inch that patient is pursuing inpatient rehab. LCSW arrived to the hospital to find patient talking with hospital LCSW, Claiborne Billings about plan. He shared concern with going home and not being able to manage and leaving problems for his wife to attend to. He shared that he is unsure how well he will do with physical therapy, yet wants to pursue.  LCSW reviewed costs of care, role of HMB and patient desires revocation of hospice services as he wants [facility] to pay for costs associated with rehab stay. Patient signed revocation documents and is aware that when facility is found and he is discharged from hospital, that hospice services will stop. Please notify [facility] when discharge is planned. Katherina Right, [place]

## 2015-03-08 NOTE — Progress Notes (Signed)
The really has not been too much change in Manuel Barr's condition. I think the real key test for him is a prealbumin. His prealbumin is only 3.3. I think this really will dictate his poor prognosis. I still think that he will not make it through January. I taught him about this today. I explained why I thought that he would not make it through January. I think he understands this. At some point, his CODE STATUS is going to have to be readdressed.  The Pleurx catheter in the left pleural space is being opened. He says this makes him feel a little bit better.  I suspect that the consolidation in his left lung is probably malignancy. I would not think that this is pneumonia.  He is clearly iron deficient. His iron studies show this. I will give him a dose of IV iron.  He did have the IVC filter placed yesterday. I appreciate radiology doing this for [place].  It sounds like he is trying to look for a place for rehabilitation. It is hard to say how much this is really going to benefit him.   His labs today shows white cell count to be 8.5. Hemoglobin 9.2. Platelet count 142,000. Potassium 3.8. Calcium 7.6.  Again, I think his prognosis is very limited. With a prealbumin avoid 3.3, I would be very hard pressed to say he will survive more than 6 weeks. He has no reserve at this point area did I think is the cancer continues to progress, he will decline quickly area did I feel that within the left lung is probably malignancy and not infection.  I do feel bad for him. He served our country. He does has a very aggressive malignancy. Hopefully, the iron will make him feel a little bit better.    Pete E. Isaiah 41:10

## 2015-03-08 NOTE — Clinical Social Work Note (Signed)
Clinical Social Work Assessment  Patient Details  Name: Manuel DREWEK MRN: [id number] Date of Birth: [birth year]  Date of referral:  03/08/15               Reason for consult:  Facility Placement                Permission sought to share information with:  Chartered certified accountant granted to share information::  Yes, Verbal Permission Granted  Name::        Agency::     Relationship::     Contact Information:     Housing/Transportation Living arrangements for the past 2 months:  [place] of Information:  Patient, Other (Comment Required) (hospice SW, Counsellor ) Patient Interpreter Needed:  None Criminal Activity/Legal Involvement Pertinent to Current Situation/Hospitalization:  No - Comment as needed Significant Relationships:  Spouse, Siblings Lives with:  Spouse Do you feel safe going back to the place where you live?  No Need for family participation in patient care:  No (Coment)  Care giving concerns:  CSW received consult from [facility] that patient is interested in SNF placement upon discharge.    Social Worker assessment / plan:  CSW spoke with patient & [facility] SW, Melanie at bedside re: discharge planning. Patient states that he does not feel comfortable returning home at this time - would like to go to SNF for short term, possible long term placement.   Employment status:  Retired Forensic scientist:    PT Recommendations:  Home with [place], [place] / Referral to community resources:  [place]  Patient/Family's Response to care:  Patient states that he was recently at [facility] and discharged home with hospice.   Patient/Family's Understanding of and Emotional Response to Diagnosis, Current Treatment, and Prognosis:    Emotional Assessment Appearance:  Appears stated age Attitude/Demeanor/Rapport:    Affect (typically observed):  Pleasant, Adaptable,  Accepting Orientation:  Oriented to Self, Oriented to Place, Oriented to  Time, Oriented to Situation Alcohol / Substance use:    Psych involvement (Current and /or in the community):     Discharge Needs  Concerns to be addressed:    Readmission within the last 30 days:    Current discharge risk:    Barriers to Discharge:      Standley Brooking, LCSW 03/08/2015, 12:35 PM

## 2015-03-09 DIAGNOSIS — I82622 Acute embolism and thrombosis of deep veins of left upper extremity: Secondary | ICD-10-CM | POA: Diagnosis present

## 2015-03-09 DIAGNOSIS — R0602 Shortness of breath: Secondary | ICD-10-CM | POA: Insufficient documentation

## 2015-03-09 DIAGNOSIS — I2699 Other pulmonary embolism without acute cor pulmonale: Secondary | ICD-10-CM | POA: Insufficient documentation

## 2015-03-09 LAB — BASIC METABOLIC PANEL
ANION GAP: 7 (ref 5–15)
BUN: 19 mg/dL (ref 6–20)
CALCIUM: 7.7 mg/dL — AB (ref 8.9–10.3)
CHLORIDE: 104 mmol/L (ref 101–111)
CO2: 25 mmol/L (ref 22–32)
CREATININE: 1.02 mg/dL (ref 0.61–1.24)
GFR calc non Af Amer: >60 mL/min (ref >60)
Glucose, Bld: 138 mg/dL — ABNORMAL HIGH (ref 65–99)
Potassium: 3.9 mmol/L (ref 3.5–5.1)
SODIUM: 136 mmol/L (ref 135–145)

## 2015-03-09 LAB — GLUCOSE, CAPILLARY
GLUCOSE-CAPILLARY: 97 mg/dL (ref 65–99)
Glucose-Capillary: 133 mg/dL — ABNORMAL HIGH (ref 65–99)
Glucose-Capillary: 131 mg/dL — ABNORMAL HIGH (ref 65–99)

## 2015-03-09 LAB — CBC
HCT: 28 % — ABNORMAL LOW (ref 39.0–52.0)
HEMOGLOBIN: 9.3 g/dL — AB (ref 13.0–17.0)
MCH: 28.7 pg (ref 26.0–34.0)
MCHC: 33.2 g/dL (ref 30.0–36.0)
MCV: 86.4 fL (ref 78.0–100.0)
PLATELETS: 134 10*3/uL — AB (ref 150–400)
RBC: 3.24 MIL/uL — AB (ref 4.22–5.81)
RDW: 17.7 % — ABNORMAL HIGH (ref 11.5–15.5)
WBC: 8.8 10*3/uL (ref 4.0–10.5)

## 2015-03-09 MED ORDER — INSULIN ASPART 100 UNIT/ML ~~LOC~~ SOLN: 0.0000 [IU] | Freq: Every day | SUBCUTANEOUS | Status: AC

## 2015-03-09 MED ORDER — GLUCERNA SHAKE PO LIQD: 237.0000 mL | Freq: Three times a day (TID) | ORAL | Status: AC

## 2015-03-09 MED ORDER — FERROUS SULFATE 325 (65 FE) MG PO TABS: 325.0000 mg | ORAL_TABLET | Freq: Two times a day (BID) | ORAL | Status: AC

## 2015-03-09 MED ORDER — HEPARIN SOD (PORK) LOCK FLUSH 100 UNIT/ML IV SOLN
500.0000 [IU] | INTRAVENOUS | Status: AC | PRN
  Administered 2015-03-09: 500 [IU]

## 2015-03-09 MED ORDER — INSULIN GLARGINE 100 UNIT/ML ~~LOC~~ SOLN: 5.0000 [IU] | Freq: Every day | SUBCUTANEOUS | Status: AC

## 2015-03-09 MED ORDER — OXYCODONE HCL 5 MG PO CAPS: 5.0000 mg | ORAL_CAPSULE | ORAL | Status: AC | PRN

## 2015-03-09 MED ORDER — INSULIN ASPART 100 UNIT/ML ~~LOC~~ SOLN: 0.0000 [IU] | Freq: Three times a day (TID) | SUBCUTANEOUS | Status: AC

## 2015-03-09 NOTE — Progress Notes (Signed)
Report called to [facility] at [facility]. Patient transported by [facility], patient stablr at time of discharge.

## 2015-03-09 NOTE — Discharge Summary (Signed)
Physician Discharge Summary  Manuel Barr [id number] DOB: [birth year] DOA: 03/05/2015  PCP: Shiela Mayer, PA  Admit date: 03/05/2015 Discharge date: 03/09/2015   Recommendations for Outpatient Follow-Up:   1. F/U with oncologist for further treatment recommendations. 2. Drain Pleurx every other day. If drainage > 200 cc, drain daily. 3. F/U final blood culture results.   Discharge Diagnosis:   Principal Problem:    Possible PNA (pneumonia) Active Problems:    Diabetes mellitus, type 2 (HCC)    Duodenal cancer (HCC)    Pulmonary embolism (HCC)    Anemia    Protein-calorie malnutrition, severe    Iron deficiency anemia due to chronic blood loss    Swelling    Symptomatic anemia    Deep vein thrombosis (DVT) of left upper extremity [facility])   Discharge disposition:  SNF:  Discharge Condition: Improved.  Diet recommendation: Carbohydrate-modified.    History of Present Illness:   Manuel Barr is an 69 y.o. male with a PMH of duodenal carcinoma/liver metastasis, recent hospitalization for treatment of pulmonary embolism and pneumonia, discharged on Lovenox who was admitted 03/05/15 chief complaint of increased weakness and dyspnea.   Hospital Course by Problem:   Principal Problem:  Healthcare associated PNA (pneumonia) - Chest x-ray consistent with ? postobstructive pneumonia/pneumonitis given known lung mass.  - Given history of recent hospitalization and known malignancy, patient was covered with cefepime and vancomycin. - Blood cultures remain negative. Empiric antibiotics discontinued 03/08/15. Continues to be afebrile and without leukocytosis. - Drain Pleurx catheter every other day.  Active Problems:  Acute kidney injury - Creatinine improved with hydration.   Diabetes mellitus, type 2 ([place]) - Currently being managed with moderate scale SSI and 5 units of Lantus daily. - CBGs 96-151.   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 - Dr. Marin Olp following, feels prognosis is poor.   Pulmonary embolism (HCC)/left upper extremity DVT - Blood thinners contraindicated secondary to anemia and presumed GI bleeding.  - IR consulted for placement of an IVC filter, which was placed 03/07/15. - LUE Swollen, Dopplers confirmed new DVT left upper extremity.   Acute blood loss anemia/iron deficiency anemia - Continue iron supplementation. Anticoagulants discontinued. - Hemoglobin 6.1 on admission, status post 2 units of PRBCs 03/05/15 with an appropriate rise in hemoglobin. - Hemoglobin remains stable off blood thinners.   Protein-calorie malnutrition, severe - Dietitian consulted. Continue Glucerna supplements. Diet liberalized.    Medical Consultants:    Oncology: Dr. Marin Olp   Discharge Exam:   Filed Vitals:   03/08/15 2135 03/09/15 0510  BP: 124/77 127/82  Pulse: 105 100  Temp: 98.3 F (36.8 C) 98.4 F (36.9 C)  Resp: 18 18   Filed Vitals:   03/08/15 1118 03/08/15 1400 03/08/15 2135 03/09/15 0510  BP: 111/79 97/70 124/77 127/82  Pulse: 98 92 105 100  Temp:  98 F (36.7 C) 98.3 F (36.8 C) 98.4 F (36.9 C)  TempSrc:  Oral Oral Oral  Resp: 16 20 18 18   Height:      Weight:      SpO2: 99% 100% 97% 96%   Gen: NAD Cardiovascular: Tachycardic, No M/R/G Respiratory: Lungs diminished Gastrointestinal: Abdomen soft, NT/ND, + BS Extremities: LUS swollen. LEs with SCDs on, 1+ edema   The results of significant diagnostics from this hospitalization (including imaging, microbiology, ancillary and laboratory) are listed below for reference.     Procedures and  Diagnostic Studies:   Dg Chest 2 View  03/05/2015  CLINICAL DATA:  Worsening shortness of breath for 2 days. History of duodenum and liver cancer. Pleural effusions. EXAM: CHEST  2 VIEW COMPARISON:  None available FINDINGS: Right IJ port catheter tip SVC  RA junction. Extensive consolidative airspace process throughout the left lung with central air bronchograms. Underlying left lung mass not excluded. Blunting of the left costophrenic angle compatible with a left effusion. No pneumothorax. Right lung remains relatively clear. Small right midline nodule appears calcified, suspicious for granulomatous disease. No right effusion. Trachea is midline. Biliary and duodenal stents noted. No acute osseous finding. IMPRESSION: Extensive consolidative airspace process throughout the left lung compatible with underlying known lung mass and likely superimposed left lung pneumonia/pneumonitis. Associated small to moderate left effusion Other chronic and postoperative findings as above. Electronically Signed   By: Jerilynn Mages.  Shick M.D.   On: 03/05/2015 15:06     Labs:   Basic Metabolic Panel:  Recent Labs Lab 03/05/15 1530 03/07/15 0405 03/08/15 0431 03/09/15 0200  NA 137  --  136 136  K 4.7  --  3.8 3.9  CL 105  --  105 104  CO2 23  --  25 25  GLUCOSE 272*  --  169* 138*  BUN 23*  --  19 19  CREATININE 1.33* 1.15 1.13 1.02  CALCIUM 7.8*  --  7.6* 7.7*   GFR Estimated Creatinine Clearance: 63.4 mL/min (by C-G formula based on Cr of 1.02). Liver Function Tests:  Recent Labs Lab 03/05/15 1530  AST 25  ALT 14*  ALKPHOS 415*  BILITOT 1.0  PROT 5.0*  ALBUMIN 1.7*   CBC:  Recent Labs Lab 03/05/15 1530 03/06/15 1057 03/08/15 0431 03/09/15 0200  WBC 8.4 9.9 8.5 8.8  NEUTROABS 7.8*  --   --   --   HGB 6.1* 9.1* 9.2* 9.3*  HCT 19.2* 27.4* 27.9* 28.0*  MCV 89.7 86.2 85.3 86.4  PLT 196 179 142* 134*   CBG:  Recent Labs Lab 03/08/15 1226 03/08/15 1707 03/08/15 2128 03/09/15 0725 03/09/15 1143  GLUCAP 151* 96 122* 36 133*   Microbiology Recent Results (from the past 240 hour(s))  Culture, blood (routine x 2) Call MD if unable to obtain prior to antibiotics being given     Status: None (Preliminary result)   Collection Time: 03/05/15  10:45 PM  Result Value Ref Range Status   Specimen Description BLOOD LEFT ARM  Final   Special Requests BOTTLES DRAWN AEROBIC ONLY 5CC  Final   Culture   Final    NO GROWTH 3 DAYS Performed at [facility]    Report Status PENDING  Incomplete  Culture, blood (routine x 2) Call MD if unable to obtain prior to antibiotics being given     Status: None (Preliminary result)   Collection Time: 03/05/15 10:50 PM  Result Value Ref Range Status   Specimen Description BLOOD RIGHT ARM  Final   Special Requests BOTTLES DRAWN AEROBIC AND ANAEROBIC 5CC  Final   Culture   Final    NO GROWTH 3 DAYS Performed at [facility]    Report Status PENDING  Incomplete     Discharge Instructions:   Discharge Instructions    Call MD for:  extreme fatigue    Complete by:  As directed      Call MD for:  persistant nausea and vomiting    Complete by:  As directed      Call MD  for:  severe uncontrolled pain    Complete by:  As directed      Call MD for:  temperature >100.4    Complete by:  As directed      Diet general    Complete by:  As directed      Increase activity slowly    Complete by:  As directed      Walk with assistance    Complete by:  As directed      Walker     Complete by:  As directed             Medication List    STOP taking these medications        aspirin 81 MG EC tablet     enoxaparin 80 MG/0.8ML injection  Commonly known as:  LOVENOX     LANTUS SOLOSTAR 100 UNIT/ML Solostar Pen  Generic drug:  Insulin Glargine  Replaced by:  insulin glargine 100 UNIT/ML injection     metFORMIN 500 MG 24 hr tablet  Commonly known as:  GLUCOPHAGE-XR     predniSONE 20 MG tablet  Commonly known as:  DELTASONE     PRESCRIPTION MEDICATION      TAKE these medications        cyclobenzaprine 10 MG tablet  Commonly known as:  FLEXERIL  Take 5 mg by mouth at bedtime as needed. Muscle spasms     feeding supplement (GLUCERNA SHAKE) Liqd  Take 237 mLs by mouth 3  (three) times daily between meals.     ferrous sulfate 325 (65 FE) MG tablet  Take 1 tablet (325 mg total) by mouth 2 (two) times daily with a meal.     fluticasone 50 MCG/ACT nasal spray  Commonly known as:  FLONASE  Place 1 spray into both nostrils daily as needed for allergies or rhinitis.     insulin aspart 100 UNIT/ML injection  Commonly known as:  novoLOG  Inject 0-15 Units into the skin 3 (three) times daily with meals.     insulin aspart 100 UNIT/ML injection  Commonly known as:  novoLOG  Inject 0-5 Units into the skin at bedtime.     insulin glargine 100 UNIT/ML injection  Commonly known as:  LANTUS  Inject 0.05 mLs (5 Units total) into the skin at bedtime.     ondansetron 8 MG tablet  Commonly known as:  ZOFRAN  Take 8 mg by mouth every 8 (eight) hours as needed for nausea or vomiting.     oxycodone 5 MG capsule  Commonly known as:  OXY-IR  Take 1 capsule (5 mg total) by mouth every 4 (four) hours as needed for pain.     pantoprazole 40 MG tablet  Commonly known as:  PROTONIX  Take 40 mg by mouth daily as needed (heartburn).           Follow-up Information    Follow up with [place], [place].   Why:  As needed   Contact information:   [place] [id number]        Time coordinating discharge: 35 minutes.  Signed:  RAMA,CHRISTINA  Pager [phone] [facility] 03/09/2015, 2:22 PM

## 2015-03-09 NOTE — Progress Notes (Signed)
Progress Note   Manuel Barr [id number] DOB: 12/21/1945 DOA: 03/05/2015 PCP: Shiela Mayer, PA   Brief Narrative:   Manuel Barr is an 69 y.o. male with a PMH of duodenal carcinoma/liver metastasis, recent hospitalization for treatment of pulmonary embolism and pneumonia, discharged on Lovenox who was admitted 03/05/15 chief complaint of increased weakness and dyspnea.  Assessment/Plan:   Principal Problem:   Healthcare associated PNA (pneumonia) - Chest x-ray consistent with ? postobstructive pneumonia/pneumonitis given known lung mass.  - Given history of recent hospitalization and known malignancy, patient was covered with cefepime and vancomycin. - Blood cultures remain negative. Empiric antibiotics discontinued 03/08/15. Continues to be afebrile and without leukocytosis. - Drain Pleurx catheter every other day.  Active Problems:   Acute kidney injury - Creatinine improved with hydration.    Diabetes mellitus, type 2 ([place]) - Currently being managed with moderate scale SSI and 5 units of Lantus daily. - CBGs 96-151.    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 - Dr. Marin Olp following, feels prognosis is poor.    Pulmonary embolism (HCC)/left upper extremity DVT - Blood thinners contraindicated secondary to anemia and presumed GI bleeding.  - IR consulted for placement of an IVC filter, which was placed 03/07/15. - LUE  Swollen, Dopplers confirmed new DVT left upper extremity.    Acute blood loss anemia/iron deficiency anemia - Continue iron supplementation. Anticoagulants discontinued. - Hemoglobin 6.1 on admission, status post 2 units of PRBCs 03/05/15 with an appropriate rise in hemoglobin. - Hemoglobin remains stable off blood thinners.    Protein-calorie malnutrition, severe - Dietitian consulted. Continue Glucerna supplements. Diet liberalized.    DVT  Prophylaxis - Lovenox discontinued secondary to anemia. Currently has SCDs on.   Family Communication/Anticipated D/C date and plan/Code Status   Family Communication: Seth Bake and mother in law at the bedside. Disposition Plan: SNF for rehab when stable. Lives with wife, Seth Bake. Anticipated D/C date:   03/09/15 to SNF if bed available and patient otherwise stable. Code Status: Full code.   IV Access:    Peripheral IV   Procedures and diagnostic studies:   Dg Chest 2 View  03/05/2015  CLINICAL DATA:  Worsening shortness of breath for 2 days. History of duodenum and liver cancer. Pleural effusions. EXAM: CHEST  2 VIEW COMPARISON:  None available FINDINGS: Right IJ port catheter tip SVC RA junction. Extensive consolidative airspace process throughout the left lung with central air bronchograms. Underlying left lung mass not excluded. Blunting of the left costophrenic angle compatible with a left effusion. No pneumothorax. Right lung remains relatively clear. Small right midline nodule appears calcified, suspicious for granulomatous disease. No right effusion. Trachea is midline. Biliary and duodenal stents noted. No acute osseous finding. IMPRESSION: Extensive consolidative airspace process throughout the left lung compatible with underlying known lung mass and likely superimposed left lung pneumonia/pneumonitis. Associated small to moderate left effusion Other chronic and postoperative findings as above. Electronically Signed   By: Jerilynn Mages.  Shick M.D.   On: 03/05/2015 15:06     Medical Consultants:    Oncology: Dr. Marin Olp  Anti-Infectives:    Cefepime 03/05/15---> 03/08/15  Vancomycin 03/05/15---> 03/08/15   Subjective:   Manuel Barr still is a bit short of breath but no complaints of cough.  Still reports fatigue.  Has left sided achy back pain/stiffness. Last BM was 2 days ago.  Passing flatus.  No N/V.  Appetite fair.  Objective:    Filed Vitals:   03/08/15 1118  03/08/15 1400 03/08/15 2135 03/09/15 0510  BP: 111/79 97/70 124/77 127/82  Pulse: 98 92 105 100  Temp:  98 F (36.7 C) 98.3 F (36.8 C) 98.4 F (36.9 C)  TempSrc:  Oral Oral Oral  Resp: 16 20 18 18   Height:      Weight:      SpO2: 99% 100% 97% 96%    Intake/Output Summary (Last 24 hours) at 03/09/15 0751 Last data filed at 03/09/15 [id number]  Gross per 24 hour  Intake    537 ml  Output    925 ml  Net   -388 ml   Filed Weights   03/05/15 1903  Weight: 68 kg (149 lb 14.6 oz)    Exam: Gen:  NAD Cardiovascular:  Tachycardic, No M/R/G Respiratory:  Lungs diminished Gastrointestinal:  Abdomen soft, NT/ND, + BS Extremities:  LUS swollen. LEs with SCDs on, 1+ edema   Data Reviewed:    Labs: Basic Metabolic Panel:  Recent Labs Lab 03/05/15 1530 03/07/15 0405 03/08/15 0431 03/09/15 0200  NA 137  --  136 136  K 4.7  --  3.8 3.9  CL 105  --  105 104  CO2 23  --  25 25  GLUCOSE 272*  --  169* 138*  BUN 23*  --  19 19  CREATININE 1.33* 1.15 1.13 1.02  CALCIUM 7.8*  --  7.6* 7.7*   GFR Estimated Creatinine Clearance: 63.4 mL/min (by C-G formula based on Cr of 1.02). Liver Function Tests:  Recent Labs Lab 03/05/15 1530  AST 25  ALT 14*  ALKPHOS 415*  BILITOT 1.0  PROT 5.0*  ALBUMIN 1.7*   CBC:  Recent Labs Lab 03/05/15 1530 03/06/15 1057 03/08/15 0431 03/09/15 0200  WBC 8.4 9.9 8.5 8.8  NEUTROABS 7.8*  --   --   --   HGB 6.1* 9.1* 9.2* 9.3*  HCT 19.2* 27.4* 27.9* 28.0*  MCV 89.7 86.2 85.3 86.4  PLT 196 179 142* 134*   CBG:  Recent Labs Lab 03/08/15 0757 03/08/15 1226 03/08/15 1707 03/08/15 2128 03/09/15 0725  GLUCAP 128* 151* 96 122* 97   Anemia work up:  Recent Labs  03/06/15 1301  FERRITIN 736*  TIBC 122*  IRON 14*  RETICCTPCT 3.6*   Sepsis Labs:  Recent Labs Lab 03/05/15 1530 03/06/15 1057 03/08/15 0431 03/09/15 0200  WBC 8.4 9.9 8.5 8.8   Microbiology Recent Results (from the past 240 hour(s))  Culture, blood (routine  x 2) Call MD if unable to obtain prior to antibiotics being given     Status: None (Preliminary result)   Collection Time: 03/05/15 10:45 PM  Result Value Ref Range Status   Specimen Description BLOOD LEFT ARM  Final   Special Requests BOTTLES DRAWN AEROBIC ONLY 5CC  Final   Culture   Final    NO GROWTH 2 DAYS Performed at [facility]    Report Status PENDING  Incomplete  Culture, blood (routine x 2) Call MD if unable to obtain prior to antibiotics being given     Status: None (Preliminary result)   Collection Time: 03/05/15 10:50 PM  Result Value Ref Range Status   Specimen Description BLOOD RIGHT ARM  Final   Special Requests BOTTLES DRAWN AEROBIC AND ANAEROBIC 5CC  Final   Culture   Final    NO GROWTH 2 DAYS Performed at [facility]    Report Status PENDING  Incomplete     Medications:   . feeding supplement (GLUCERNA SHAKE)  237 mL Oral TID BM  . ferrous sulfate  325 mg Oral BID WC  . insulin aspart  0-15 Units Subcutaneous TID WC  . insulin aspart  0-5 Units Subcutaneous QHS  . insulin glargine  5 Units Subcutaneous QHS  . sodium chloride  3 mL Intravenous Q12H   Continuous Infusions:   Time spent: 25 minutes.   LOS: 3 days   [facility] Pager [phone]. If unable to reach me by pager, please call my cell phone at [phone].  *Please refer to [email or web address], password [email or web address] to get updated schedule on who will round on this patient, as hospitalists switch teams weekly. If 7PM-7AM, please contact night-coverage at [email or web address], password [email or web address] for any overnight needs.  03/09/2015, 7:51 AM

## 2015-03-09 NOTE — Progress Notes (Signed)
Physical Therapy Treatment Patient Details Name: Manuel Barr MRN: [id number] DOB: 1945-12-04 Today's Date: 04/05/2015    History of Present Illness 69 yo male admitted with shortness of breath, pna, weakness. 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  Pt s/p IVC filter 03/07/15    PT Comments    Pt assisted OOB and able to ambulate in room however fatigues very quickly.  Follow Up Recommendations  Supervision/Assistance - 24 hour;SNF (plans for SNF, pt feels unable to return home)     Equipment Recommendations  None recommended by PT    Recommendations for Other Services       Precautions / Restrictions Precautions Precautions: Fall Precaution Comments: drain    Mobility  Bed Mobility Overal bed mobility: Needs Assistance Bed Mobility: Supine to Sit     Supine to sit: Min assist     General bed mobility comments: increased time. assist for trunk. required rest break upon sitting EOB prior to standing  Transfers Overall transfer level: Needs assistance Equipment used: Rolling walker (2 wheeled) Transfers: Sit to/from Stand Sit to Stand: Min assist         General transfer comment: verbal cues for hand placement, assist to rise and steady  Ambulation/Gait Ambulation/Gait assistance: Min assist Ambulation Distance (Feet): 10 Feet Assistive device: Rolling walker (2 wheeled) Gait Pattern/deviations: Step-through pattern;Decreased stride length;Trunk flexed     General Gait Details: verbal cues for safe use of RW, ambulated in room to doorway and then recliner, fatigues quickly   Stairs            Wheelchair Mobility    Modified Rankin (Stroke Patients Only)       Balance                                    Cognition Arousal/Alertness: Awake/alert Behavior During Therapy: WFL for tasks assessed/performed Overall Cognitive Status: Within Functional Limits for tasks assessed                      Exercises      General Comments        Pertinent Vitals/Pain Pain Assessment: No/denies pain    Home Living                      Prior Function            PT Goals (current goals can now be found in the care plan section) Progress towards PT goals: Progressing toward goals    Frequency  Min 3X/week    PT Plan Discharge plan needs to be updated    Co-evaluation             End of Session Equipment Utilized During Treatment: Gait belt Activity Tolerance: Patient limited by fatigue Patient left: in chair;with call bell/phone within reach;with chair alarm set     Time: [id number] PT Time Calculation (min) (ACUTE ONLY): 10 min  Charges:  $Gait Training: 8-22 mins                    G Codes:      Kariann Wecker,KATHrine E 03/17/2015, 12:38 PM Carmelia Bake, PT, DPT Mar 22, 2015 Pager: [phone]

## 2015-03-09 NOTE — Clinical Social Work Placement (Signed)
Patient is set to discharge to [facility] today. Patient & wife, [name] aware. Discharge packet given to RN, [facility]. [facility] called for transport.     Raynaldo Opitz, [facility] Clinical Social Worker cell #: [phone]    CLINICAL SOCIAL WORK PLACEMENT  NOTE  Date:  03/09/2015  Patient Details  Name: Manuel Barr MRN: [id number] Date of Birth: [birth year]  Clinical Social Work is seeking post-discharge placement for this patient at the [place] level of care (*CSW will initial, date and re-position this form in  chart as items are completed):  Yes   Patient/family provided with [place] Work Department's list of facilities offering this level of care within the geographic area requested by the patient (or if unable, by the patient's family).  Yes   Patient/family informed of their freedom to choose among providers that offer the needed level of care, that participate in Medicare, Medicaid or managed care program needed by the patient, have an available bed and are willing to accept the patient.  Yes   Patient/family informed of [place]'s ownership interest in [facility] and [facility], as well as of the fact that they are under no obligation to receive care at these facilities.  PASRR submitted to EDS on 03/08/15     PASRR number received on 03/08/15     Existing PASRR number confirmed on       FL2 transmitted to all facilities in geographic area requested by pt/family on       FL2 transmitted to all facilities within larger geographic area on       Patient informed that his/her managed care company has contracts with or will negotiate with certain facilities, including the following:        Yes   Patient/family informed of bed offers received.  Patient chooses bed at [facility] recommends and patient chooses bed at      Patient to be transferred to [facility] on 03/09/15.  Patient to be transferred to facility by [facility]     Patient family notified on 03/09/15 of transfer.  Name of family member notified:  patient's wife, [name] via phone     PHYSICIAN       Additional Comment:    _______________________________________________ Standley Brooking, LCSW 03/09/2015, 3:28 PM

## 2015-03-09 NOTE — Progress Notes (Signed)
Inpatient [facility]-Room 1408-[facility] RN Visit  This is a related admission to [facility] diagnosis of Cancer of Duodenum. Patient is a Full Code. Patient seen in room and was talking with a nurse liaison from [facility]). He is considering this as an option for rehab. No d/c orders at this time.  Please call with any questions.  [facility] Liaison [phone]

## 2015-03-09 NOTE — Care Management Note (Signed)
Case Management Note  Patient Details  Name: Manuel Barr MRN: [id number] Date of Birth: 05-10-1945  Subjective/Objective:    D/c SNF in am.CSW following.                Action/Plan:   Expected Discharge Date:                  Expected Discharge Plan:  Skilled Nursing Facility  In-House Referral:  Clinical Social Work  Discharge planning Services  CM Consult  Post Acute Care Choice:  Hospice ([facility] active.) Choice offered to:     DME Arranged:    DME Agency:     HH Arranged:    [place] Agency:     Status of Service:  In process, will continue to follow  Medicare Important Message Given:    Date Medicare IM Given:    Medicare IM give by:    Date Additional Medicare IM Given:    Additional Medicare Important Message give by:     If discussed at [place] of Stay Meetings, dates discussed:    Additional Comments:  Dessa Phi, RN 03/09/2015, 1:33 PM

## 2015-03-09 NOTE — Progress Notes (Signed)
Nutrition Follow-up  DOCUMENTATION CODES:   Severe malnutrition in context of chronic illness  INTERVENTION:  - Continue Glucerna Shake TID - Continue to encourage pt during meals - RD will continue to monitor for needs  NUTRITION DIAGNOSIS:   Increased nutrient needs related to catabolic illness, cancer and cancer related treatments as evidenced by estimated needs. -ongoing  GOAL:   Patient will meet greater than or equal to 90% of their needs -unmet on average  MONITOR:   PO intake, Supplement acceptance, Weight trends, Labs, Skin, I & O's  ASSESSMENT:   69 year-old with recent history of admission into the hospital earlier this month which patient was diagnosed with PE and pneumonia. 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 Patient was placed on Lovenox. He reports that he came to the hospital because he felt weaker and was having increased shortness of breath and dyspnea on exertion. The problem since onset which she reports was roughly 2 days ago has been persistent and gradually getting worse. Nothing he is aware of makes it better. Activity makes it worse. He also endorses hemoptysis  12/29 Per chart review, pt ate 35% of breakfast 12/27, 35% of breakfast and 0% of lunch and dinner yesterday (12/28), and 75% of breakfast today. Pt reports that taste sensation continues to improve and that he has been limiting foods with extra seasoning as these items have been causing a burning sensation to his tongue. He also states that he has been trying to eat a little bit more at each meal, even if it is 1-2 bites in addition to trying some new foods each day. Pt states that he has been consuming Glucerna Shake at room temperature but denies cold items causing any discomfort for him; he states he drank it TID yesterday.  Pt not meeting needs on average but intakes are improving and he is doing well with supplement. Medications reviewed. Labs reviewed; CBGs: 78-183  mg/dL, Ca: 7.7 mg/dL.    12/26 - Pt reports that for breakfast he ate a few bites of banana and yogurt.  - Visualized lunch tray with a few bites of cottage cheese and a few sips of apple juice taken.  - PTA pt's appetite was very poor and he would typically eat 2 very small meals/snacks per day.  - Pt reports that since he had stent placed in stomach he was advised to consume only soft foods to avoid buildup around the stent.  - He states that he has not had overt abdominal pain or nausea with intakes today or PTA.  - He denies chewing or swallowing issues.  - Pt does state that he previously was experiencing taste alteration (which he was unable to further specify) but states that this is beginning to resolve and that foods are beginning to taste good again.  - Visitor states that in May 2016 pt weighed 230 lbs. This indicates 81 lb weight loss (35% body weight) in 7 months which is significant for time frame. - Chart indicates that over the past 4 months pt has lost 37 lbs (20% body weight) which is also significant for time frame.  - Physical assessment indicates severe muscle and fat wasting.  - Pt was drinking Glucerna Shake PTA and he is interested in having Ensure Enlive switched to Glucerna.  Diet Order:  Diet regular Room service appropriate?: Yes; Fluid consistency:: Thin  Skin:  Reviewed, no issues  Last BM:  12/26  Height:   Ht Readings from Last  1 Encounters:  03/05/15 5\' 6"  (1.676 m)    Weight:   Wt Readings from Last 1 Encounters:  03/05/15 149 lb 14.6 oz (68 kg)    Ideal Body Weight:  64.54 kg (kg)  BMI:  Body mass index is 24.21 kg/(m^2).  Estimated Nutritional Needs:   Kcal:  2100-2300  Protein:  80-95 grams  Fluid:  2-2.2 L/day  EDUCATION NEEDS:   No education needs identified at this time     Jarome Matin, RD, LDN Inpatient Clinical Dietitian Pager # [phone] After hours/weekend pager # [phone]

## 2015-03-11 LAB — CULTURE, BLOOD (ROUTINE X 2)
CULTURE: NO GROWTH
Culture: NO GROWTH

## 2015-03-12 ENCOUNTER — Emergency Department (HOSPITAL_COMMUNITY)
Admission: EM | Admit: 2015-03-12 | Discharge: 2015-03-12 | Disposition: A | Discharge status: Home / Self Care | Payer: Medicare Other | Attending: Emergency Medicine | Admitting: Emergency Medicine

## 2015-03-12 ENCOUNTER — Encounter (HOSPITAL_COMMUNITY): Payer: Self-pay | Admitting: Emergency Medicine

## 2015-03-12 ENCOUNTER — Emergency Department (HOSPITAL_COMMUNITY): Payer: Medicare Other

## 2015-03-12 DIAGNOSIS — Z8505 Personal history of malignant neoplasm of liver: Secondary | ICD-10-CM | POA: Diagnosis not present

## 2015-03-12 DIAGNOSIS — K219 Gastro-esophageal reflux disease without esophagitis: Secondary | ICD-10-CM | POA: Diagnosis not present

## 2015-03-12 DIAGNOSIS — Z87442 Personal history of urinary calculi: Secondary | ICD-10-CM | POA: Insufficient documentation

## 2015-03-12 DIAGNOSIS — J869 Pyothorax without fistula: Secondary | ICD-10-CM

## 2015-03-12 DIAGNOSIS — Z9981 Dependence on supplemental oxygen: Secondary | ICD-10-CM | POA: Diagnosis not present

## 2015-03-12 DIAGNOSIS — I10 Essential (primary) hypertension: Secondary | ICD-10-CM | POA: Insufficient documentation

## 2015-03-12 DIAGNOSIS — R042 Hemoptysis: Secondary | ICD-10-CM | POA: Diagnosis present

## 2015-03-12 DIAGNOSIS — Z7982 Long term (current) use of aspirin: Secondary | ICD-10-CM | POA: Insufficient documentation

## 2015-03-12 DIAGNOSIS — E119 Type 2 diabetes mellitus without complications: Secondary | ICD-10-CM | POA: Diagnosis not present

## 2015-03-12 DIAGNOSIS — G4730 Sleep apnea, unspecified: Secondary | ICD-10-CM | POA: Diagnosis not present

## 2015-03-12 DIAGNOSIS — R040 Epistaxis: Secondary | ICD-10-CM | POA: Insufficient documentation

## 2015-03-12 HISTORY — DX: Pyothorax without fistula: J86.9

## 2015-03-12 LAB — BASIC METABOLIC PANEL
Anion gap: 8 (ref 5–15)
BUN: 15 mg/dL (ref 6–20)
CO2: 24 mmol/L (ref 22–32)
Calcium: 7.6 mg/dL — ABNORMAL LOW (ref 8.9–10.3)
Chloride: 103 mmol/L (ref 101–111)
Creatinine, Ser: 0.96 mg/dL (ref 0.61–1.24)
GFR calc Af Amer: >60 mL/min (ref >60)
GFR calc non Af Amer: >60 mL/min (ref >60)
Glucose, Bld: 148 mg/dL — ABNORMAL HIGH (ref 65–99)
Potassium: 4.2 mmol/L (ref 3.5–5.1)
Sodium: 135 mmol/L (ref 135–145)

## 2015-03-12 LAB — CBC WITH DIFFERENTIAL/PLATELET
Basophils Absolute: 0 10*3/uL (ref 0.0–0.1)
Basophils Relative: 0 %
Eosinophils Absolute: 0.1 10*3/uL (ref 0.0–0.7)
Eosinophils Relative: 1 %
HCT: 31.4 % — ABNORMAL LOW (ref 39.0–52.0)
Hemoglobin: 10.1 g/dL — ABNORMAL LOW (ref 13.0–17.0)
Lymphocytes Relative: 9 %
Lymphs Abs: 0.9 10*3/uL (ref 0.7–4.0)
MCH: 28.1 pg (ref 26.0–34.0)
MCHC: 32.2 g/dL (ref 30.0–36.0)
MCV: 87.2 fL (ref 78.0–100.0)
Monocytes Absolute: 0.8 10*3/uL (ref 0.1–1.0)
Monocytes Relative: 8 %
Neutro Abs: 8.1 10*3/uL — ABNORMAL HIGH (ref 1.7–7.7)
Neutrophils Relative %: 82 %
Platelets: 131 10*3/uL — ABNORMAL LOW (ref 150–400)
RBC: 3.6 MIL/uL — ABNORMAL LOW (ref 4.22–5.81)
RDW: 17.9 % — ABNORMAL HIGH (ref 11.5–15.5)
WBC: 9.9 10*3/uL (ref 4.0–10.5)

## 2015-03-12 MED ORDER — OXYMETAZOLINE HCL 0.05 % NA SOLN
1.0000 | Freq: Once | NASAL | Status: AC
  Administered 2015-03-12: 1 via NASAL
  Filled 2015-03-12: qty 15

## 2015-03-12 NOTE — Discharge Instructions (Signed)
Your hemoptysis (coughing up blood) is likely from recently diagnosed pneumonia or pulmonary embolism. Hemoptysis can be seen in either of these settings. May potentially be coughing out blood from a nosebleed although I feel that this is less likely. Your blood levels have improved from discharge from the hospital. You are not on blood thinners because of your recent GI bleeding. Your chest x-ray does not show any acute abnormality. At this time feel that you do not require hospitalization. He needs to watch her symptoms. If you feel like you are coughing up significantly more blood, having increasing shortness of breath, increasing pain, fever or other concerning symptoms then you need to be rechecked.

## 2015-03-12 NOTE — ED Notes (Signed)
Attempted report to nursing home.  Phone rang to hall with no answer.

## 2015-03-12 NOTE — ED Notes (Signed)
Attempted report.  Phone rang to receiving staff member with no answer.

## 2015-03-12 NOTE — ED Notes (Signed)
Attempted report.  Phone rang to receiving staff members direct number x 1 with no answer both called ending with busy signals.

## 2015-03-12 NOTE — ED Notes (Signed)
Patient transported to X-ray 

## 2015-03-12 NOTE — ED Notes (Addendum)
Pt from [facility] via [place] with c/o blood from his nose after feeling nasal congestion and wiping with his hand.  Pt then coughed blood about 5-10 minutes later.  Bleeding never uncontrolled.  Pt additionally reports blurry vision which is a continuation from previous hospitalization on 03/05/15.  Pt received 3 units of blood during that admission.  NAD, A&O.

## 2015-03-12 NOTE — ED Provider Notes (Signed)
CSN: [id number]     Arrival date & time 03/12/15  0909 History   First MD Initiated Contact with Patient 03/12/15 [phone]     Chief Complaint  Patient presents with  . Hemoptysis  . Epistaxis     (Consider location/radiation/quality/duration/timing/severity/associated sxs/prior Treatment) HPI   70 year old male with hemoptysis. Patient has a complicated past medical history. He was just recently discharged from the hospital. He has no lung cancer. Imaging at that time also with possible postobstructive pneumonia/pneumonitis. He was placed on antibiotics. Also pulmonary embolism. He is currently not on anticoagulation secondary to significant GI bleeding requiring transfusion. He does have an IVC filter. He began having hemoptysis earlier today. He has coughed a few times and brought up bright red blood and a small clot. Also had a bloody nose earlier today which he feels has since resolved. Denies any overt bleeding anywhere else.   Past Medical History  Diagnosis Date  . Type II diabetes mellitus ([place])   . Hyperlipemia     Archie Endo 11/07/2014  . Hypertension     Archie Endo 11/07/2014  . Kidney stones   . Cancer of duodenum ([place]) dx'd 08/2014    w/liver mets/notes 11/07/2014  . Liver cancer ([place]) dx'd 08/2014  . Macular degeneration, bilateral   . GERD (gastroesophageal reflux disease)   . Sleep apnea     "suppose to use a mask but I haven't in awhile" (11/07/2014)   Past Surgical History  Procedure Laterality Date  . Abdominal exploration surgery  08/2014    attempted whipple per wife -but unable to resect /notes 11/07/2014  . Bile duct stent placement  09/2014    Archie Endo 11/07/2014  . Cholecystectomy open  08/2014   Family History  Problem Relation Age of Onset  . Diabetes Mother   . Diabetes Father   . Heart disease Father   . Emphysema Father    Social History  Substance Use Topics  . Smoking status: Never Smoker   . Smokeless tobacco: Never Used  . Alcohol Use: No    Review of  Systems  All systems reviewed and negative, other than as noted in HPI.   Allergies  Codeine and Other  Home Medications   Prior to Admission medications   Medication Sig Start Date End Date Taking? Authorizing Provider  cyclobenzaprine (FLEXERIL) 10 MG tablet Take 5 mg by mouth at bedtime as needed. Muscle spasms    Historical Provider, MD  feeding supplement, GLUCERNA SHAKE, (GLUCERNA SHAKE) LIQD Take 237 mLs by mouth 3 (three) times daily between meals. 03/09/15   Venetia Maxon Rama, MD  ferrous sulfate 325 (65 FE) MG tablet Take 1 tablet (325 mg total) by mouth 2 (two) times daily with a meal. 03/09/15   Christina P Rama, MD  fluticasone (FLONASE) 50 MCG/ACT nasal spray Place 1 spray into both nostrils daily as needed for allergies or rhinitis.     Historical Provider, MD  insulin aspart (NOVOLOG) 100 UNIT/ML injection Inject 0-15 Units into the skin 3 (three) times daily with meals. 03/09/15   Venetia Maxon Rama, MD  insulin aspart (NOVOLOG) 100 UNIT/ML injection Inject 0-5 Units into the skin at bedtime. 03/09/15   Venetia Maxon Rama, MD  insulin glargine (LANTUS) 100 UNIT/ML injection Inject 0.05 mLs (5 Units total) into the skin at bedtime. 03/09/15   Christina P Rama, MD  ondansetron (ZOFRAN) 8 MG tablet Take 8 mg by mouth every 8 (eight) hours as needed for nausea or vomiting.     Historical  Provider, MD  oxycodone (OXY-IR) 5 MG capsule Take 1 capsule (5 mg total) by mouth every 4 (four) hours as needed for pain. 03/09/15   Christina P Rama, MD  pantoprazole (PROTONIX) 40 MG tablet Take 40 mg by mouth daily as needed (heartburn).     Historical Provider, MD   BP 125/83 mmHg  Pulse 102  Temp(Src) 97.2 F (36.2 C) (Oral)  Resp 18  SpO2 96% Physical Exam  Constitutional: No distress.  Frail & chronically ill appearing, but does not appear acutely distressed.  HENT:  Head: Normocephalic and atraumatic.  Eyes: Conjunctivae are normal. Right eye exhibits no discharge. Left eye  exhibits no discharge.  Neck: Neck supple.  Cardiovascular: Normal rate, regular rhythm and normal heart sounds.  Exam reveals no gallop and no friction rub.   No murmur heard. Pulmonary/Chest: Effort normal. No respiratory distress.  Coarse breath sounds bilaterally. Occasionally coughing and spitting up small amount of bright red blood mixed in with sputum. Pleurx catheter.  Abdominal: Soft. He exhibits no distension. There is no tenderness.  Musculoskeletal: He exhibits no edema or tenderness.  Neurological: He is alert.  Skin: Skin is warm and dry.  Psychiatric: He has a normal mood and affect. His behavior is normal. Thought content normal.  Nursing note and vitals reviewed.   ED Course  Procedures (including critical care time) Labs Review Labs Reviewed  CBC WITH DIFFERENTIAL/PLATELET - Abnormal; Notable for the following:    RBC 3.60 (*)    Hemoglobin 10.1 (*)    HCT 31.4 (*)    RDW 17.9 (*)    Platelets 131 (*)    Neutro Abs 8.1 (*)    All other components within normal limits  BASIC METABOLIC PANEL    Imaging Review Dg Chest 2 View  03/12/2015  CLINICAL DATA:  70 year old male with shortness of breath. History of duodenal and liver cancer. EXAM: CHEST  2 VIEW COMPARISON:  03/05/2015 FINDINGS: The cardiomediastinal silhouette is unchanged. Continued consolidation/airspace disease within the left lung with left pleural effusion again noted with slight improved aeration of the left lung. A right sided Port-A-Cath is again noted with tip overlying the superior cavoatrial junction. There is no evidence of pneumothorax. IMPRESSION: Slight improved aeration of the left lung without other significant change. Continued left lower lung airspace disease/ consolidation and left pleural effusion. Electronically Signed   By: Margarette Canada M.D.   On: 03/12/2015 10:49   I have personally reviewed and evaluated these images and lab results as part of my medical decision-making.   EKG  Interpretation None      MDM   Final diagnoses:  Hemoptysis    70 year old male with hemoptysis. Patient has several possible reasons for the hemoptysis. Recent diagnosis and treatment for pneumonia. Also has known pulmonary embolism. Recent epistaxis although this appears to be currently resolved my exam and is still coughing up some blood. He has no acute respiratory complaints. Chest x-ray is without acute abnormality. His anemia has continued to improve since discharge. He is not on blood thinners secondary to GI bleed requiring transfusion during this past hospitalization. He is status post IVC filter. Mild thrombocytopenia which is probably noncontributory.   At this point I'm not sure what acute intervention he would benefit from. Given some intranasal afrin although I suspect the bleeding is actually pulmonary. I feel he is stable for discharge. Needs to continue to observe his symptoms. Needs repeat evaluation for increasing bleeding or other acute symptoms.  Virgel Manifold, MD 04/18/2015 [phone]

## 2015-03-16 ENCOUNTER — Encounter: Payer: Self-pay | Admitting: Internal Medicine

## 2015-03-16 ENCOUNTER — Non-Acute Institutional Stay (SKILLED_NURSING_FACILITY): Payer: Medicare Other | Admitting: Internal Medicine

## 2015-03-16 DIAGNOSIS — Z794 Long term (current) use of insulin: Secondary | ICD-10-CM

## 2015-03-16 DIAGNOSIS — I10 Essential (primary) hypertension: Secondary | ICD-10-CM | POA: Diagnosis not present

## 2015-03-16 DIAGNOSIS — E43 Unspecified severe protein-calorie malnutrition: Secondary | ICD-10-CM | POA: Diagnosis not present

## 2015-03-16 DIAGNOSIS — E1129 Type 2 diabetes mellitus with other diabetic kidney complication: Secondary | ICD-10-CM

## 2015-03-16 DIAGNOSIS — I2699 Other pulmonary embolism without acute cor pulmonale: Secondary | ICD-10-CM | POA: Diagnosis not present

## 2015-03-16 DIAGNOSIS — B370 Candidal stomatitis: Secondary | ICD-10-CM | POA: Diagnosis not present

## 2015-03-16 DIAGNOSIS — J189 Pneumonia, unspecified organism: Secondary | ICD-10-CM

## 2015-03-16 DIAGNOSIS — I82622 Acute embolism and thrombosis of deep veins of left upper extremity: Secondary | ICD-10-CM | POA: Diagnosis not present

## 2015-03-16 DIAGNOSIS — D500 Iron deficiency anemia secondary to blood loss (chronic): Secondary | ICD-10-CM

## 2015-03-16 DIAGNOSIS — Z95828 Presence of other vascular implants and grafts: Secondary | ICD-10-CM

## 2015-03-16 DIAGNOSIS — C170 Malignant neoplasm of duodenum: Secondary | ICD-10-CM | POA: Diagnosis not present

## 2015-03-16 NOTE — Progress Notes (Signed)
Patient ID: CASHIUS GRANDSTAFF, male   DOB: 05-04-1945, 70 y.o.   MRN: [id number]    HISTORY AND PHYSICAL   DATE: 03/16/15  Location:  [facility]    Place of Service: SNF (31)   Extended Emergency Contact Information Primary Emergency Contact: Waibel,Robert Address: [address].          [place], [place] [address] [place] Phone: [phone] Relation: Brother Secondary Emergency Contact: Lincoln Maxin [place] Mobile Phone: [phone] Relation: Spouse  Advanced Directive information  FULL CODE  Chief Complaint  Patient presents with  . 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 CXR c/w probable postobstructive pneumonia/pneumonitis in light of known lung mass. Covered with IV cefepime and vanco due to hx recent admission and known malignancy. BC neg. Empiric abx d/c'd 12/28th. Labs c/w AKI which improved with hydration.  Followed by hospice and prognosis is poor. Followed by oncology Dr Marin Olp. Given 2 units PRBCs 12/25th for hgb 6.1. He was given nutritional supplements for severe protein calorie malnutrition. Prealbumin 3.3; Hgb 9.3; Cr 1.33-->1.02 at d/c. He has a pleurix drain and IVC filter. He is on lovenox injections. He presents to SNF for short term rehab  His wife is present. Pt c/o burning sensation in mouth and reduced appetite. He is drinking liquids as tolerated. No other concerns.. No falls. Followed by hospice and is full code. He was sent to the ED 1/1st for hemoptysis and epistaxis. Hgb 10.1. CXR showed no acute process and improved aeration. He was adminstered intranasal Afrin and sent back to SNF    Past Medical History  Diagnosis Date  . Type II diabetes  mellitus ([place])   . Hyperlipemia     Archie Endo 11/07/2014  . Hypertension     Archie Endo 11/07/2014  . Kidney stones   . 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 Liver cancer ([place]) dx'd 08/2014  . Macular degeneration, bilateral   . GERD (gastroesophageal reflux disease)   . Sleep apnea     "suppose to use a mask but I haven't in awhile" (11/07/2014)    Past Surgical History  Procedure Laterality Date  . Abdominal exploration surgery  08/2014    attempted whipple per wife -but unable to resect /notes 11/07/2014  . Bile duct stent placement  09/2014    Archie Endo 11/07/2014  . Cholecystectomy open  08/2014    Patient Care Team: Shiela Mayer, [place] as PCP - General  Social History   Social History  . Marital Status: Married    Spouse Name: N/A  . Number of Children: N/A  . Years of Education: N/A   Occupational History  . Not on file.   Social History Main Topics  . Smoking status: Never Smoker   . Smokeless tobacco: Never Used  . Alcohol Use: No  . Drug Use: No  . Sexual Activity: Not Currently   Other Topics Concern  . Not on file   Social History Narrative     reports that he has never smoked. He has never used smokeless tobacco. He reports that he does not drink alcohol or use illicit drugs.  Family History  Problem Relation Age  of Onset  . Diabetes Mother   . Diabetes Father   . Heart disease Father   . Emphysema Father    Family Status  Relation Status Death Age  . Mother Deceased   . Father Deceased   . Brother Marketing executive History  Administered Date(s) Administered  . Pneumococcal-Unspecified 06/10/2014    Allergies  Allergen Reactions  . Codeine Nausea And Vomiting  . Other     Pt is on a Mechanical Soft Diet, has a stint in stomach.    Medications: Patient's Medications  New Prescriptions   No medications on file  Previous Medications   CYCLOBENZAPRINE (FLEXERIL) 10 MG TABLET    Take 5 mg by mouth at bedtime  as needed. Muscle spasms   FEEDING SUPPLEMENT, GLUCERNA SHAKE, (GLUCERNA SHAKE) LIQD    Take 237 mLs by mouth 3 (three) times daily between meals.   FERROUS SULFATE 325 (65 FE) MG TABLET    Take 1 tablet (325 mg total) by mouth 2 (two) times daily with a meal.   FLUTICASONE (FLONASE) 50 MCG/ACT NASAL SPRAY    Place 1 spray into both nostrils daily as needed for allergies or rhinitis.    INSULIN ASPART (NOVOLOG) 100 UNIT/ML INJECTION    Inject 0-15 Units into the skin 3 (three) times daily with meals.   INSULIN ASPART (NOVOLOG) 100 UNIT/ML INJECTION    Inject 0-5 Units into the skin at bedtime.   INSULIN GLARGINE (LANTUS) 100 UNIT/ML INJECTION    Inject 0.05 mLs (5 Units total) into the skin at bedtime.   ONDANSETRON (ZOFRAN) 8 MG TABLET    Take 8 mg by mouth every 8 (eight) hours as needed for nausea or vomiting.    OXYCODONE (OXY-IR) 5 MG CAPSULE    Take 1 capsule (5 mg total) by mouth every 4 (four) hours as needed for pain.   PANTOPRAZOLE (PROTONIX) 40 MG TABLET    Take 40 mg by mouth daily as needed (heartburn).   Modified Medications   No medications on file  Discontinued Medications   No medications on file    Review of Systems  Constitutional: Positive for activity change, appetite change and fatigue.  HENT: Positive for sore throat.   Neurological: Positive for weakness.  All other systems reviewed and are negative.   Filed Vitals:   03/16/15 1644  BP: 102/71  Pulse: 118  Temp: 97.9 F (36.6 C)  Weight: 149 lb 12.8 oz (67.949 kg)   Body mass index is 24.19 kg/(m^2).  Physical Exam  Constitutional: He is oriented to person, place, and time. He appears well-developed and well-nourished.  Frail appearing in NAD, lying in bed, pale  HENT:  Mouth/Throat: Oropharynx is clear and moist.  Oral cavity reveals white mucosal lesions. No ulcerations  Eyes: Pupils are equal, round, and reactive to light. No scleral icterus.  Neck: Neck supple. Carotid bruit is not present. No  thyromegaly present.  Cardiovascular: Regular rhythm and intact distal pulses.  Tachycardia present.  Exam reveals no gallop and no friction rub.   Murmur heard.  Systolic murmur is present with a grade of 1/6  +1pitting LE edema b/l. No calf TTP  Pulmonary/Chest: Effort normal and breath sounds normal. He has no wheezes. He has no rales. He exhibits no tenderness.  Abdominal: Soft. Bowel sounds are normal. He exhibits no distension, no abdominal bruit, no pulsatile midline mass and no mass. There is no tenderness. There is no rebound and no guarding.  Lymphadenopathy:  He has no cervical adenopathy.  Neurological: He is alert and oriented to person, place, and time. He has normal reflexes.  Skin: Skin is warm and dry. No rash noted.  pleurix drain intact  Psychiatric: He has a normal mood and affect. His behavior is normal. Judgment and thought content normal.     Labs reviewed: Admission on 03/12/2015, Discharged on 03/12/2015  Component Date Value Ref Range Status  . WBC 03/12/2015 9.9  4.0 - 10.5 K/uL Final  . RBC 03/12/2015 3.60* 4.22 - 5.81 MIL/uL Final  . Hemoglobin 03/12/2015 10.1* 13.0 - 17.0 g/dL Final  . HCT 03/12/2015 31.4* 39.0 - 52.0 % Final  . MCV 03/12/2015 87.2  78.0 - 100.0 fL Final  . MCH 03/12/2015 28.1  26.0 - 34.0 pg Final  . MCHC 03/12/2015 32.2  30.0 - 36.0 g/dL Final  . RDW 03/12/2015 17.9* 11.5 - 15.5 % Final  . Platelets 03/12/2015 131* 150 - 400 K/uL Final  . Neutrophils Relative % 03/12/2015 82   Final  . Neutro Abs 03/12/2015 8.1* 1.7 - 7.7 K/uL Final  . Lymphocytes Relative 03/12/2015 9   Final  . Lymphs Abs 03/12/2015 0.9  0.7 - 4.0 K/uL Final  . Monocytes Relative 03/12/2015 8   Final  . Monocytes Absolute 03/12/2015 0.8  0.1 - 1.0 K/uL Final  . Eosinophils Relative 03/12/2015 1   Final  . Eosinophils Absolute 03/12/2015 0.1  0.0 - 0.7 K/uL Final  . Basophils Relative 03/12/2015 0   Final  . Basophils Absolute 03/12/2015 0.0  0.0 - 0.1 K/uL  Final  . Sodium 03/12/2015 135  135 - 145 mmol/L Final  . Potassium 03/12/2015 4.2  3.5 - 5.1 mmol/L Final  . Chloride 03/12/2015 103  101 - 111 mmol/L Final  . CO2 03/12/2015 24  22 - 32 mmol/L Final  . Glucose, Bld 03/12/2015 148* 65 - 99 mg/dL Final  . BUN 03/12/2015 15  6 - 20 mg/dL Final  . Creatinine, Ser 03/12/2015 0.96  0.61 - 1.24 mg/dL Final  . Calcium 03/12/2015 7.6* 8.9 - 10.3 mg/dL Final  . GFR calc non Af Amer 03/12/2015 >60  >60 mL/min Final  . GFR calc Af Amer 03/12/2015 >60  >60 mL/min Final   Comment: (NOTE) The eGFR has been calculated using the CKD EPI equation. This calculation has not been validated in all clinical situations. eGFR's persistently <60 mL/min signify possible Chronic Kidney Disease.   . Anion gap 03/12/2015 8  5 - 15 Final  Admission on 03/05/2015, Discharged on 03/09/2015  Component Date Value Ref Range Status  . WBC 03/05/2015 8.4  4.0 - 10.5 K/uL Final  . RBC 03/05/2015 2.14* 4.22 - 5.81 MIL/uL Final  . Hemoglobin 03/05/2015 6.1* 13.0 - 17.0 g/dL Final   Comment: RESULT REPEATED AND VERIFIED CRITICAL RESULT CALLED TO, READ BACK BY AND VERIFIED WITH: TALKINGTON,J AT 1545 ON [id number] BY HOOKER,B   . HCT 03/05/2015 19.2* 39.0 - 52.0 % Final  . MCV 03/05/2015 89.7  78.0 - 100.0 fL Final  . MCH 03/05/2015 28.5  26.0 - 34.0 pg Final  . MCHC 03/05/2015 31.8  30.0 - 36.0 g/dL Final  . RDW 03/05/2015 18.2* 11.5 - 15.5 % Final  . Platelets 03/05/2015 196  150 - 400 K/uL Final  . Neutrophils Relative % 03/05/2015 93   Final  . Lymphocytes Relative 03/05/2015 5   Final  . Monocytes Relative 03/05/2015 2   Final  . Eosinophils Relative 03/05/2015 0  Final  . Basophils Relative 03/05/2015 0   Final  . Neutro Abs 03/05/2015 7.8* 1.7 - 7.7 K/uL Final  . Lymphs Abs 03/05/2015 0.4* 0.7 - 4.0 K/uL Final  . Monocytes Absolute 03/05/2015 0.2  0.1 - 1.0 K/uL Final  . Eosinophils Absolute 03/05/2015 0.0  0.0 - 0.7 K/uL Final  . Basophils Absolute 03/05/2015  0.0  0.0 - 0.1 K/uL Final  . Smear Review 03/05/2015 MORPHOLOGY UNREMARKABLE   Final  . Sodium 03/05/2015 137  135 - 145 mmol/L Final  . Potassium 03/05/2015 4.7  3.5 - 5.1 mmol/L Final  . Chloride 03/05/2015 105  101 - 111 mmol/L Final  . CO2 03/05/2015 23  22 - 32 mmol/L Final  . Glucose, Bld 03/05/2015 272* 65 - 99 mg/dL Final  . BUN 03/05/2015 23* 6 - 20 mg/dL Final  . Creatinine, Ser 03/05/2015 1.33* 0.61 - 1.24 mg/dL Final  . Calcium 03/05/2015 7.8* 8.9 - 10.3 mg/dL Final  . GFR calc non Af Amer 03/05/2015 54* >60 mL/min Final  . GFR calc Af Amer 03/05/2015 >60  >60 mL/min Final   Comment: (NOTE) The eGFR has been calculated using the CKD EPI equation. This calculation has not been validated in all clinical situations. eGFR's persistently <60 mL/min signify possible Chronic Kidney Disease.   . Anion gap 03/05/2015 9  5 - 15 Final  . Total Protein 03/05/2015 5.0* 6.5 - 8.1 g/dL Final  . Albumin 03/05/2015 1.7* 3.5 - 5.0 g/dL Final  . AST 03/05/2015 25  15 - 41 U/L Final  . ALT 03/05/2015 14* 17 - 63 U/L Final  . Alkaline Phosphatase 03/05/2015 415* 38 - 126 U/L Final  . Total Bilirubin 03/05/2015 1.0  0.3 - 1.2 mg/dL Final  . Bilirubin, Direct 03/05/2015 0.3  0.1 - 0.5 mg/dL Final  . Indirect Bilirubin 03/05/2015 0.7  0.3 - 0.9 mg/dL Final  . B Natriuretic Peptide 03/05/2015 113.2* 0.0 - 100.0 pg/mL Final  . Troponin i, poc 03/05/2015 0.06  0.00 - 0.08 ng/mL Final  . Comment 3 03/05/2015          Final   Comment: Due to the release kinetics of cTnI, a negative result within the first hours of the onset of symptoms does not rule out myocardial infarction with certainty. If myocardial infarction is still suspected, repeat the test at appropriate intervals.   . ABO/RH(D) 03/05/2015 O POS   Final  . Antibody Screen 03/05/2015 NEG   Final  . Sample Expiration 03/05/2015 03/08/2015   Final  . Unit Number 03/05/2015 [id number]   Final  . Blood Component Type 03/05/2015 RBC  CPDA1, LR   Final  . Unit division 03/05/2015 00   Final  . Status of Unit 03/05/2015 ISSUED,FINAL   Final  . Transfusion Status 03/05/2015 OK TO TRANSFUSE   Final  . Crossmatch Result 03/05/2015 Compatible   Final  . Unit Number 03/05/2015 [id number]   Final  . Blood Component Type 03/05/2015 RED CELLS,LR   Final  . Unit division 03/05/2015 00   Final  . Status of Unit 03/05/2015 ISSUED,FINAL   Final  . Transfusion Status 03/05/2015 OK TO TRANSFUSE   Final  . Crossmatch Result 03/05/2015 Compatible   Final  . Order Confirmation 03/05/2015 ORDER PROCESSED BY BLOOD BANK   Final  . ABO/RH(D) 03/05/2015 O POS   Final  . HIV Screen 4th Generation wRfx 03/05/2015 Non Reactive  Non Reactive Final   Comment: (NOTE) Performed At: [facility] [address]  [address] [place], [place] [id number] Lindon Romp MD YQ:[id number]   . Specimen Description 03/05/2015 BLOOD RIGHT ARM   Final  . Special Requests 03/05/2015 BOTTLES DRAWN AEROBIC AND ANAEROBIC 5CC   Final  . Culture 03/05/2015    Final                   Value:NO GROWTH 5 DAYS Performed at [facility]   . Report Status 03/05/2015 03/11/2015 FINAL   Final  . Specimen Description 03/05/2015 BLOOD LEFT ARM   Final  . Special Requests 03/05/2015 BOTTLES DRAWN AEROBIC ONLY 5CC   Final  . Culture 03/05/2015    Final                   Value:NO GROWTH 5 DAYS Performed at [facility]   . Report Status 03/05/2015 03/11/2015 FINAL   Final  . Strep Pneumo Urinary Antigen 03/06/2015 NEGATIVE  NEGATIVE Final   Comment:        Infection due to S. pneumoniae cannot be absolutely ruled out since the antigen present may be below the detection limit of the test. Performed at [facility]   . Glucose-Capillary 03/05/2015 254* 65 - 99 mg/dL Final  . Glucose-Capillary 03/06/2015 205* 65 - 99 mg/dL Final  . WBC 03/06/2015 9.9  4.0 - 10.5 K/uL Final  . RBC 03/06/2015 3.18* 4.22 - 5.81 MIL/uL Final  . Hemoglobin  03/06/2015 9.1* 13.0 - 17.0 g/dL Final   Comment: REPEATED TO VERIFY DELTA CHECK NOTED POST TRANSFUSION SPECIMEN   . HCT 03/06/2015 27.4* 39.0 - 52.0 % Final  . MCV 03/06/2015 86.2  78.0 - 100.0 fL Final  . MCH 03/06/2015 28.6  26.0 - 34.0 pg Final  . MCHC 03/06/2015 33.2  30.0 - 36.0 g/dL Final  . RDW 03/06/2015 17.0* 11.5 - 15.5 % Final  . Platelets 03/06/2015 179  150 - 400 K/uL Final  . Glucose-Capillary 03/06/2015 210* 65 - 99 mg/dL Final  . Prealbumin 03/06/2015 3.3* 18 - 38 mg/dL Final   Performed at [facility] 03/06/2015 3.6* 0.4 - 3.1 % Final  . RBC. 03/06/2015 3.35* 4.22 - 5.81 MIL/uL Final  . Retic Count, Manual 03/06/2015 120.6  19.0 - 186.0 K/uL Final  . Smear Review 03/06/2015 SMEAR STAINED AND AVAILABLE FOR REVIEW   Final  . Iron 03/06/2015 14* 45 - 182 ug/dL Final  . TIBC 03/06/2015 122* 250 - 450 ug/dL Final  . Saturation Ratios 03/06/2015 11* 17.9 - 39.5 % Final  . UIBC 03/06/2015 108   Final   Performed at [facility]  . Ferritin 03/06/2015 736* 24 - 336 ng/mL Final   Performed at [facility]  . Fecal Occult Bld 03/06/2015 POSITIVE* NEGATIVE Final  . Glucose-Capillary 03/06/2015 135* 65 - 99 mg/dL Final  . Creatinine, Ser 03/07/2015 1.15  0.61 - 1.24 mg/dL Final  . GFR calc non Af Amer 03/07/2015 >60  >60 mL/min Final  . GFR calc Af Amer 03/07/2015 >60  >60 mL/min Final   Comment: (NOTE) The eGFR has been calculated using the CKD EPI equation. This calculation has not been validated in all clinical situations. eGFR's persistently <60 mL/min signify possible Chronic Kidney Disease.   . Vancomycin Tr 03/07/2015 20  10.0 - 20.0 ug/mL Final  . Glucose-Capillary 03/06/2015 152* 65 - 99 mg/dL Final  . Glucose-Capillary 03/07/2015 78  65 - 99 mg/dL Final  . Glucose-Capillary 03/07/2015 166* 65 - 99  mg/dL Final  . WBC 03/08/2015 8.5  4.0 - 10.5 K/uL Final  . RBC 03/08/2015 3.27* 4.22 - 5.81 MIL/uL Final  . Hemoglobin  03/08/2015 9.2* 13.0 - 17.0 g/dL Final  . HCT 03/08/2015 27.9* 39.0 - 52.0 % Final  . MCV 03/08/2015 85.3  78.0 - 100.0 fL Final  . MCH 03/08/2015 28.1  26.0 - 34.0 pg Final  . MCHC 03/08/2015 33.0  30.0 - 36.0 g/dL Final  . RDW 03/08/2015 17.7* 11.5 - 15.5 % Final  . Platelets 03/08/2015 142* 150 - 400 K/uL Final  . Sodium 03/08/2015 136  135 - 145 mmol/L Final  . Potassium 03/08/2015 3.8  3.5 - 5.1 mmol/L Final  . Chloride 03/08/2015 105  101 - 111 mmol/L Final  . CO2 03/08/2015 25  22 - 32 mmol/L Final  . Glucose, Bld 03/08/2015 169* 65 - 99 mg/dL Final  . BUN 03/08/2015 19  6 - 20 mg/dL Final  . Creatinine, Ser 03/08/2015 1.13  0.61 - 1.24 mg/dL Final  . Calcium 03/08/2015 7.6* 8.9 - 10.3 mg/dL Final  . GFR calc non Af Amer 03/08/2015 >60  >60 mL/min Final  . GFR calc Af Amer 03/08/2015 >60  >60 mL/min Final   Comment: (NOTE) The eGFR has been calculated using the CKD EPI equation. This calculation has not been validated in all clinical situations. eGFR's persistently <60 mL/min signify possible Chronic Kidney Disease.   . Anion gap 03/08/2015 6  5 - 15 Final  . Glucose-Capillary 03/07/2015 170* 65 - 99 mg/dL Final  . Glucose-Capillary 03/07/2015 183* 65 - 99 mg/dL Final  . Glucose-Capillary 03/08/2015 128* 65 - 99 mg/dL Final  . Comment 1 03/08/2015 Notify RN   Final  . Comment 2 03/08/2015 Document in Chart   Final  . Sodium 03/09/2015 136  135 - 145 mmol/L Final  . Potassium 03/09/2015 3.9  3.5 - 5.1 mmol/L Final  . Chloride 03/09/2015 104  101 - 111 mmol/L Final  . CO2 03/09/2015 25  22 - 32 mmol/L Final  . Glucose, Bld 03/09/2015 138* 65 - 99 mg/dL Final  . BUN 03/09/2015 19  6 - 20 mg/dL Final  . Creatinine, Ser 03/09/2015 1.02  0.61 - 1.24 mg/dL Final  . Calcium 03/09/2015 7.7* 8.9 - 10.3 mg/dL Final  . GFR calc non Af Amer 03/09/2015 >60  >60 mL/min Final  . GFR calc Af Amer 03/09/2015 >60  >60 mL/min Final   Comment: (NOTE) The eGFR has been calculated using the  CKD EPI equation. This calculation has not been validated in all clinical situations. eGFR's persistently <60 mL/min signify possible Chronic Kidney Disease.   . Anion gap 03/09/2015 7  5 - 15 Final  . WBC 03/09/2015 8.8  4.0 - 10.5 K/uL Final  . RBC 03/09/2015 3.24* 4.22 - 5.81 MIL/uL Final  . Hemoglobin 03/09/2015 9.3* 13.0 - 17.0 g/dL Final  . HCT 03/09/2015 28.0* 39.0 - 52.0 % Final  . MCV 03/09/2015 86.4  78.0 - 100.0 fL Final  . MCH 03/09/2015 28.7  26.0 - 34.0 pg Final  . MCHC 03/09/2015 33.2  30.0 - 36.0 g/dL Final  . RDW 03/09/2015 17.7* 11.5 - 15.5 % Final  . Platelets 03/09/2015 134* 150 - 400 K/uL Final  . Glucose-Capillary 03/08/2015 151* 65 - 99 mg/dL Final  . Comment 1 03/08/2015 Notify RN   Final  . Comment 2 03/08/2015 Document in Chart   Final  . Glucose-Capillary 03/08/2015 96  65 - 99 mg/dL  Final  . Comment 1 03/08/2015 Notify RN   Final  . Comment 2 03/08/2015 Document in Chart   Final  . Glucose-Capillary 03/08/2015 122* 65 - 99 mg/dL Final  . Glucose-Capillary 03/09/2015 97  65 - 99 mg/dL Final  . Glucose-Capillary 03/09/2015 133* 65 - 99 mg/dL Final  . Glucose-Capillary 03/09/2015 131* 65 - 99 mg/dL Final    Dg Chest 2 View  03/12/2015  CLINICAL DATA:  71 year old male with shortness of breath. History of duodenal and liver cancer. EXAM: CHEST  2 VIEW COMPARISON:  03/05/2015 FINDINGS: The cardiomediastinal silhouette is unchanged. Continued consolidation/airspace disease within the left lung with left pleural effusion again noted with slight improved aeration of the left lung. A right sided Port-A-Cath is again noted with tip overlying the superior cavoatrial junction. There is no evidence of pneumothorax. IMPRESSION: Slight improved aeration of the left lung without other significant change. Continued left lower lung airspace disease/ consolidation and left pleural effusion. Electronically Signed   By: Margarette Canada M.D.   On: 03/12/2015 10:49   Dg Chest 2  View  03/05/2015  CLINICAL DATA:  Worsening shortness of breath for 2 days. History of duodenum and liver cancer. Pleural effusions. EXAM: CHEST  2 VIEW COMPARISON:  None available FINDINGS: Right IJ port catheter tip SVC RA junction. Extensive consolidative airspace process throughout the left lung with central air bronchograms. Underlying left lung mass not excluded. Blunting of the left costophrenic angle compatible with a left effusion. No pneumothorax. Right lung remains relatively clear. Small right midline nodule appears calcified, suspicious for granulomatous disease. No right effusion. Trachea is midline. Biliary and duodenal stents noted. No acute osseous finding. IMPRESSION: Extensive consolidative airspace process throughout the left lung compatible with underlying known lung mass and likely superimposed left lung pneumonia/pneumonitis. Associated small to moderate left effusion Other chronic and postoperative findings as above. Electronically Signed   By: Jerilynn Mages.  Shick M.D.   On: 03/05/2015 15:06   Ir Ivc Filter Plmt / S&i /img Guid/mod Sed  03/07/2015  CLINICAL DATA:  Recent pulmonary embolism. History of duodenal adenocarcinoma and current GI bleed with contraindication to continued anticoagulation. Request has been made to place an IVC filter. EXAM: 1. ULTRASOUND GUIDANCE FOR VASCULAR ACCESS OF THE RIGHT INTERNAL JUGULAR VEIN. 2. IVC VENOGRAM. 3. PERCUTANEOUS IVC FILTER PLACEMENT. ANESTHESIA/SEDATION: 2.5 mg IV Versed; 50 mcg IV Fentanyl. Total Moderate Sedation Time 14 minutes. CONTRAST:  29 mL Omnipaque 300 FLUOROSCOPY TIME:  1 minute and 30 seconds. PROCEDURE: The procedure, risks, benefits, and alternatives were explained to the patient. Questions regarding the procedure were encouraged and answered. The patient understands and consents to the procedure. The right neck was prepped with Betadine in a sterile fashion, and a sterile drape was applied covering the operative field. A sterile gown  and sterile gloves were used for the procedure. Local anesthesia was provided with 1% Lidocaine. Ultrasound was used to confirm patency of the right internal jugular vein. Under direct ultrasound guidance, a 21 gauge needle was advanced into the right internal jugular vein with ultrasound image documentation performed. After securing access with a micropuncture dilator, a guidewire was advanced into the inferior vena cava. A deployment sheath was advanced over the guidewire. This was utilized to perform IVC venography. The deployment sheath was further positioned in an appropriate location for filter deployment. A Bard Denali IVC filter was then advanced in the sheath. This was then fully deployed in the infrarenal IVC. Final filter position was confirmed with a fluoroscopic  spot image. Contrast injection was also performed through the sheath under fluoroscopy to confirm patency of the IVC at the level of the filter. After the procedure the sheath was removed and hemostasis obtained with manual compression. COMPLICATIONS: None. FINDINGS: IVC venography demonstrates a normal caliber IVC with no evidence of thrombus. Renal veins are identified bilaterally. There is a fairly low positioned right renal veins/ accessory right renal vein at the level of the superior endplate of L2. The IVC filter was successfully positioned below the level of the renal veins and is appropriately oriented. This IVC filter has both permanent and retrievable indications. IMPRESSION: Placement of percutaneous IVC filter in infrarenal IVC. IVC venogram shows no evidence of IVC thrombus and normal caliber of the inferior vena cava. This filter does have both permanent and retrievable indications. Electronically Signed   By: Aletta Edouard M.D.   On: 03/07/2015 17:06     Assessment/Plan   ICD-9-CM ICD-10-CM   1. Oral candidiasis 112.0 B37.0   2. HCAP (healthcare-associated pneumonia) 72 J18.9   3. Protein-calorie malnutrition, severe  262 E43   4. Iron deficiency anemia due to chronic blood loss 280.0 D50.0   5. Type 2 diabetes mellitus with other diabetic kidney complication, with long-term current use of insulin (HCC)  E11.29     Z79.4   6. Pulmonary embolism, other ([place])  I26.99   7. Acute deep vein thrombosis (DVT) of other vein of left upper extremity (HCC)  I82.622   8. Essential hypertension 401.9 I10   9. 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 S/P IVC filter V45.89 Z95.828     Nystatin susp swish and swallow 6 cc po TID x 7 days for oral candidiasis  Cont current meds as ordered  F/u with oncology as scheduled  Hospice to follow. Code status needs to be readdressed  Cont nutritional supplements as ordered  PT/OT/ST as indicated  pleurix cath care is indicated  GOAL: short term rehab and d/c home when medically appropriate. Prognosis is poor. Communicated with pt and nursing.  Will follow  Jeziah Kretschmer S. Perlie Gold  [facility] [address] [place], [place] [address] [phone] Cell (Monday-Friday 8 AM - 5 PM) [phone] After 5 PM and follow prompts

## 2015-03-18 ENCOUNTER — Inpatient Hospital Stay (HOSPITAL_COMMUNITY)
Admission: EM | Admit: 2015-03-18 | Discharge: 2015-04-12 | DRG: 871 | Disposition: E | Payer: Medicare Other | Attending: Internal Medicine | Admitting: Internal Medicine

## 2015-03-18 ENCOUNTER — Encounter (HOSPITAL_COMMUNITY): Payer: Self-pay | Admitting: *Deleted

## 2015-03-18 ENCOUNTER — Emergency Department (HOSPITAL_COMMUNITY): Payer: Medicare Other

## 2015-03-18 ENCOUNTER — Inpatient Hospital Stay (HOSPITAL_COMMUNITY): Payer: Medicare Other

## 2015-03-18 DIAGNOSIS — K449 Diaphragmatic hernia without obstruction or gangrene: Secondary | ICD-10-CM | POA: Diagnosis present

## 2015-03-18 DIAGNOSIS — Z86711 Personal history of pulmonary embolism: Secondary | ICD-10-CM

## 2015-03-18 DIAGNOSIS — G43909 Migraine, unspecified, not intractable, without status migrainosus: Secondary | ICD-10-CM | POA: Diagnosis present

## 2015-03-18 DIAGNOSIS — I2699 Other pulmonary embolism without acute cor pulmonale: Secondary | ICD-10-CM | POA: Diagnosis present

## 2015-03-18 DIAGNOSIS — J910 Malignant pleural effusion: Secondary | ICD-10-CM | POA: Diagnosis present

## 2015-03-18 DIAGNOSIS — Y95 Nosocomial condition: Secondary | ICD-10-CM | POA: Diagnosis present

## 2015-03-18 DIAGNOSIS — E119 Type 2 diabetes mellitus without complications: Secondary | ICD-10-CM | POA: Diagnosis present

## 2015-03-18 DIAGNOSIS — C7800 Secondary malignant neoplasm of unspecified lung: Secondary | ICD-10-CM | POA: Diagnosis present

## 2015-03-18 DIAGNOSIS — J9811 Atelectasis: Secondary | ICD-10-CM | POA: Diagnosis present

## 2015-03-18 DIAGNOSIS — Z515 Encounter for palliative care: Secondary | ICD-10-CM | POA: Diagnosis present

## 2015-03-18 DIAGNOSIS — Z885 Allergy status to narcotic agent status: Secondary | ICD-10-CM | POA: Diagnosis not present

## 2015-03-18 DIAGNOSIS — R64 Cachexia: Secondary | ICD-10-CM | POA: Diagnosis present

## 2015-03-18 DIAGNOSIS — D62 Acute posthemorrhagic anemia: Secondary | ICD-10-CM | POA: Diagnosis present

## 2015-03-18 DIAGNOSIS — R0600 Dyspnea, unspecified: Secondary | ICD-10-CM

## 2015-03-18 DIAGNOSIS — C170 Malignant neoplasm of duodenum: Secondary | ICD-10-CM | POA: Diagnosis present

## 2015-03-18 DIAGNOSIS — C799 Secondary malignant neoplasm of unspecified site: Secondary | ICD-10-CM

## 2015-03-18 DIAGNOSIS — G4733 Obstructive sleep apnea (adult) (pediatric): Secondary | ICD-10-CM | POA: Diagnosis present

## 2015-03-18 DIAGNOSIS — H3530 Unspecified macular degeneration: Secondary | ICD-10-CM | POA: Diagnosis present

## 2015-03-18 DIAGNOSIS — Z794 Long term (current) use of insulin: Secondary | ICD-10-CM | POA: Diagnosis not present

## 2015-03-18 DIAGNOSIS — E43 Unspecified severe protein-calorie malnutrition: Secondary | ICD-10-CM | POA: Diagnosis present

## 2015-03-18 DIAGNOSIS — Z79899 Other long term (current) drug therapy: Secondary | ICD-10-CM | POA: Diagnosis not present

## 2015-03-18 DIAGNOSIS — J189 Pneumonia, unspecified organism: Secondary | ICD-10-CM | POA: Diagnosis present

## 2015-03-18 DIAGNOSIS — R69 Illness, unspecified: Secondary | ICD-10-CM

## 2015-03-18 DIAGNOSIS — E039 Hypothyroidism, unspecified: Secondary | ICD-10-CM | POA: Diagnosis present

## 2015-03-18 DIAGNOSIS — E872 Acidosis, unspecified: Secondary | ICD-10-CM | POA: Diagnosis present

## 2015-03-18 DIAGNOSIS — Z87442 Personal history of urinary calculi: Secondary | ICD-10-CM | POA: Diagnosis not present

## 2015-03-18 DIAGNOSIS — A419 Sepsis, unspecified organism: Secondary | ICD-10-CM | POA: Diagnosis present

## 2015-03-18 DIAGNOSIS — Z833 Family history of diabetes mellitus: Secondary | ICD-10-CM | POA: Diagnosis not present

## 2015-03-18 DIAGNOSIS — K210 Gastro-esophageal reflux disease with esophagitis: Secondary | ICD-10-CM | POA: Diagnosis present

## 2015-03-18 DIAGNOSIS — Z86718 Personal history of other venous thrombosis and embolism: Secondary | ICD-10-CM | POA: Diagnosis not present

## 2015-03-18 DIAGNOSIS — Z8249 Family history of ischemic heart disease and other diseases of the circulatory system: Secondary | ICD-10-CM | POA: Diagnosis not present

## 2015-03-18 DIAGNOSIS — I10 Essential (primary) hypertension: Secondary | ICD-10-CM | POA: Diagnosis present

## 2015-03-18 DIAGNOSIS — R188 Other ascites: Secondary | ICD-10-CM | POA: Diagnosis present

## 2015-03-18 DIAGNOSIS — Z22322 Carrier or suspected carrier of Methicillin resistant Staphylococcus aureus: Secondary | ICD-10-CM

## 2015-03-18 DIAGNOSIS — Z6829 Body mass index (BMI) 29.0-29.9, adult: Secondary | ICD-10-CM | POA: Diagnosis not present

## 2015-03-18 DIAGNOSIS — D6859 Other primary thrombophilia: Secondary | ICD-10-CM | POA: Diagnosis not present

## 2015-03-18 DIAGNOSIS — E875 Hyperkalemia: Secondary | ICD-10-CM | POA: Diagnosis present

## 2015-03-18 DIAGNOSIS — R451 Restlessness and agitation: Secondary | ICD-10-CM

## 2015-03-18 DIAGNOSIS — C787 Secondary malignant neoplasm of liver and intrahepatic bile duct: Secondary | ICD-10-CM | POA: Diagnosis present

## 2015-03-18 DIAGNOSIS — Z825 Family history of asthma and other chronic lower respiratory diseases: Secondary | ICD-10-CM

## 2015-03-18 DIAGNOSIS — J869 Pyothorax without fistula: Secondary | ICD-10-CM | POA: Diagnosis present

## 2015-03-18 DIAGNOSIS — N289 Disorder of kidney and ureter, unspecified: Secondary | ICD-10-CM

## 2015-03-18 DIAGNOSIS — D696 Thrombocytopenia, unspecified: Secondary | ICD-10-CM | POA: Diagnosis present

## 2015-03-18 DIAGNOSIS — D72829 Elevated white blood cell count, unspecified: Secondary | ICD-10-CM

## 2015-03-18 DIAGNOSIS — A047 Enterocolitis due to Clostridium difficile: Secondary | ICD-10-CM | POA: Diagnosis present

## 2015-03-18 DIAGNOSIS — R6521 Severe sepsis with septic shock: Secondary | ICD-10-CM | POA: Diagnosis present

## 2015-03-18 DIAGNOSIS — D500 Iron deficiency anemia secondary to blood loss (chronic): Secondary | ICD-10-CM | POA: Diagnosis present

## 2015-03-18 DIAGNOSIS — R531 Weakness: Secondary | ICD-10-CM | POA: Diagnosis present

## 2015-03-18 DIAGNOSIS — Z66 Do not resuscitate: Secondary | ICD-10-CM | POA: Diagnosis present

## 2015-03-18 DIAGNOSIS — K269 Duodenal ulcer, unspecified as acute or chronic, without hemorrhage or perforation: Secondary | ICD-10-CM | POA: Diagnosis present

## 2015-03-18 DIAGNOSIS — E785 Hyperlipidemia, unspecified: Secondary | ICD-10-CM | POA: Diagnosis present

## 2015-03-18 DIAGNOSIS — N179 Acute kidney failure, unspecified: Secondary | ICD-10-CM | POA: Diagnosis present

## 2015-03-18 DIAGNOSIS — K921 Melena: Secondary | ICD-10-CM | POA: Insufficient documentation

## 2015-03-18 DIAGNOSIS — E869 Volume depletion, unspecified: Secondary | ICD-10-CM | POA: Diagnosis present

## 2015-03-18 DIAGNOSIS — D649 Anemia, unspecified: Secondary | ICD-10-CM | POA: Diagnosis present

## 2015-03-18 HISTORY — DX: Other pulmonary embolism without acute cor pulmonale: I26.99

## 2015-03-18 HISTORY — DX: Gastrointestinal hemorrhage, unspecified: K92.2

## 2015-03-18 HISTORY — DX: Pyothorax without fistula: J86.9

## 2015-03-18 LAB — CBC WITH DIFFERENTIAL/PLATELET
BASOS ABS: 0 10*3/uL (ref 0.0–0.1)
Basophils Relative: 0 %
EOS ABS: 0 10*3/uL (ref 0.0–0.7)
EOS PCT: 0 %
HCT: 26.5 % — ABNORMAL LOW (ref 39.0–52.0)
Hemoglobin: 8.3 g/dL — ABNORMAL LOW (ref 13.0–17.0)
LYMPHS ABS: 1.5 10*3/uL (ref 0.7–4.0)
Lymphocytes Relative: 6 %
MCH: 29.1 pg (ref 26.0–34.0)
MCHC: 31.3 g/dL (ref 30.0–36.0)
MCV: 93 fL (ref 78.0–100.0)
MONO ABS: 0.7 10*3/uL (ref 0.1–1.0)
MONOS PCT: 3 %
NEUTROS ABS: 22.6 10*3/uL — AB (ref 1.7–7.7)
Neutrophils Relative %: 91 %
PLATELETS: 151 10*3/uL (ref 150–400)
RBC: 2.85 MIL/uL — ABNORMAL LOW (ref 4.22–5.81)
RDW: 19.1 % — AB (ref 11.5–15.5)
WBC: 24.8 10*3/uL — ABNORMAL HIGH (ref 4.0–10.5)

## 2015-03-18 LAB — COMPREHENSIVE METABOLIC PANEL
ALBUMIN: 1.6 g/dL — AB (ref 3.5–5.0)
ALK PHOS: 909 U/L — AB (ref 38–126)
ALT: 38 U/L (ref 17–63)
ANION GAP: 17 — AB (ref 5–15)
AST: 52 U/L — ABNORMAL HIGH (ref 15–41)
BILIRUBIN TOTAL: 2.6 mg/dL — AB (ref 0.3–1.2)
BUN: 53 mg/dL — ABNORMAL HIGH (ref 6–20)
CALCIUM: 7.8 mg/dL — AB (ref 8.9–10.3)
CO2: 18 mmol/L — ABNORMAL LOW (ref 22–32)
Chloride: 103 mmol/L (ref 101–111)
Creatinine, Ser: 2.02 mg/dL — ABNORMAL HIGH (ref 0.61–1.24)
GFR calc Af Amer: 38 mL/min — ABNORMAL LOW (ref >60)
GFR, EST NON AFRICAN AMERICAN: 32 mL/min — AB (ref >60)
GLUCOSE: 170 mg/dL — AB (ref 65–99)
POTASSIUM: 5.4 mmol/L — AB (ref 3.5–5.1)
Sodium: 138 mmol/L (ref 135–145)
TOTAL PROTEIN: 4.6 g/dL — AB (ref 6.5–8.1)

## 2015-03-18 LAB — I-STAT CG4 LACTIC ACID, ED: LACTIC ACID, VENOUS: 2.83 mmol/L — AB (ref 0.5–2.0)

## 2015-03-18 LAB — BRAIN NATRIURETIC PEPTIDE: B Natriuretic Peptide: 113 pg/mL — ABNORMAL HIGH (ref 0.0–100.0)

## 2015-03-18 LAB — PROTIME-INR
INR: 1.24 (ref 0.00–1.49)
PROTHROMBIN TIME: 15.8 s — AB (ref 11.6–15.2)

## 2015-03-18 LAB — LIPASE, BLOOD: LIPASE: 11 U/L (ref 11–51)

## 2015-03-18 LAB — TROPONIN I: TROPONIN I: 0.71 ng/mL — AB (ref <0.031)

## 2015-03-18 MED ORDER — ENOXAPARIN SODIUM 30 MG/0.3ML ~~LOC~~ SOLN: 30.0000 mg | SUBCUTANEOUS | Status: DC

## 2015-03-18 MED ORDER — FLUTICASONE PROPIONATE 50 MCG/ACT NA SUSP
1.0000 | Freq: Every day | NASAL | Status: DC | PRN
  Filled 2015-03-18: qty 16

## 2015-03-18 MED ORDER — SODIUM CHLORIDE 0.9 % IV BOLUS (SEPSIS)
1000.0000 mL | Freq: Once | INTRAVENOUS | Status: AC
  Administered 2015-03-18: 1000 mL via INTRAVENOUS

## 2015-03-18 MED ORDER — SODIUM CHLORIDE 0.9 % IV SOLN
INTRAVENOUS | Status: DC
  Administered 2015-03-18: 18:00:00 via INTRAVENOUS

## 2015-03-18 MED ORDER — INSULIN ASPART 100 UNIT/ML ~~LOC~~ SOLN
0.0000 [IU] | Freq: Three times a day (TID) | SUBCUTANEOUS | Status: DC
  Administered 2015-03-19: 1 [IU] via SUBCUTANEOUS
  Administered 2015-03-22: 3 [IU] via SUBCUTANEOUS

## 2015-03-18 MED ORDER — VANCOMYCIN HCL IN DEXTROSE 1-5 GM/200ML-% IV SOLN
1000.0000 mg | Freq: Once | INTRAVENOUS | Status: DC
Start: 2015-03-18 — End: 2015-03-19
  Filled 2015-03-18: qty 200

## 2015-03-18 MED ORDER — SENNOSIDES-DOCUSATE SODIUM 8.6-50 MG PO TABS: 1.0000 | ORAL_TABLET | Freq: Every evening | ORAL | Status: DC | PRN

## 2015-03-18 MED ORDER — ACETAMINOPHEN 325 MG PO TABS: 650.0000 mg | ORAL_TABLET | Freq: Four times a day (QID) | ORAL | Status: DC | PRN

## 2015-03-18 MED ORDER — ONDANSETRON HCL 4 MG/2ML IJ SOLN: 4.0000 mg | Freq: Four times a day (QID) | INTRAMUSCULAR | Status: AC | PRN

## 2015-03-18 MED ORDER — ALUM & MAG HYDROXIDE-SIMETH 200-200-20 MG/5ML PO SUSP: 30.0000 mL | Freq: Four times a day (QID) | ORAL | Status: DC | PRN

## 2015-03-18 MED ORDER — PIPERACILLIN-TAZOBACTAM 3.375 G IVPB 30 MIN
3.3750 g | Freq: Three times a day (TID) | INTRAVENOUS | Status: DC
  Administered 2015-03-19 – 2015-03-22 (×11): 3.375 g via INTRAVENOUS
  Filled 2015-03-18 (×19): qty 50

## 2015-03-18 MED ORDER — OXYCODONE HCL 5 MG PO TABS
5.0000 mg | ORAL_TABLET | ORAL | Status: DC | PRN
Start: 2015-03-18 — End: 2015-03-22
  Administered 2015-03-19 – 2015-03-21 (×4): 5 mg via ORAL
  Filled 2015-03-18 (×4): qty 1

## 2015-03-18 MED ORDER — FERROUS SULFATE 325 (65 FE) MG PO TABS
325.0000 mg | ORAL_TABLET | Freq: Two times a day (BID) | ORAL | Status: DC
  Administered 2015-03-19 – 2015-03-21 (×4): 325 mg via ORAL
  Filled 2015-03-18 (×4): qty 1

## 2015-03-18 MED ORDER — SODIUM CHLORIDE 0.9 % IV SOLN
INTRAVENOUS | Status: DC
  Administered 2015-03-18 – 2015-03-19 (×2): via INTRAVENOUS
  Administered 2015-03-20: 1000 mL via INTRAVENOUS

## 2015-03-18 MED ORDER — VANCOMYCIN HCL IN DEXTROSE 1-5 GM/200ML-% IV SOLN
1000.0000 mg | INTRAVENOUS | Status: DC
  Administered 2015-03-19 – 2015-03-21 (×3): 1000 mg via INTRAVENOUS
  Filled 2015-03-18 (×3): qty 200

## 2015-03-18 MED ORDER — GLUCERNA SHAKE PO LIQD
237.0000 mL | Freq: Three times a day (TID) | ORAL | Status: DC
  Administered 2015-03-18 – 2015-03-20 (×4): 237 mL via ORAL
  Filled 2015-03-18 (×12): qty 237

## 2015-03-18 MED ORDER — SODIUM CHLORIDE 0.9 % IV BOLUS (SEPSIS)
500.0000 mL | Freq: Once | INTRAVENOUS | Status: AC
Start: 2015-03-18 — End: 2015-03-18
  Administered 2015-03-18: 500 mL via INTRAVENOUS

## 2015-03-18 MED ORDER — ACETAMINOPHEN 650 MG RE SUPP: 650.0000 mg | Freq: Four times a day (QID) | RECTAL | Status: DC | PRN

## 2015-03-18 MED ORDER — INSULIN GLARGINE 100 UNIT/ML ~~LOC~~ SOLN
5.0000 [IU] | Freq: Every day | SUBCUTANEOUS | Status: DC
  Administered 2015-03-18 – 2015-03-19 (×2): 5 [IU] via SUBCUTANEOUS
  Filled 2015-03-18 (×2): qty 0.05

## 2015-03-18 MED ORDER — HYDROMORPHONE HCL 1 MG/ML IJ SOLN: 0.5000 mg | INTRAMUSCULAR | Status: AC | PRN

## 2015-03-18 MED ORDER — GLUCERNA PO LIQD: 237.0000 mL | Freq: Three times a day (TID) | ORAL | Status: DC

## 2015-03-18 MED ORDER — MORPHINE SULFATE (PF) 4 MG/ML IV SOLN
4.0000 mg | Freq: Once | INTRAVENOUS | Status: AC
  Administered 2015-03-18: 4 mg via INTRAVENOUS
  Filled 2015-03-18: qty 1

## 2015-03-18 MED ORDER — DEXTROSE 5 % IV SOLN
1.0000 g | Freq: Two times a day (BID) | INTRAVENOUS | Status: DC
  Filled 2015-03-18: qty 1

## 2015-03-18 MED ORDER — SODIUM CHLORIDE 0.9 % IJ SOLN
3.0000 mL | Freq: Two times a day (BID) | INTRAMUSCULAR | Status: DC
  Administered 2015-03-19 – 2015-03-22 (×6): 3 mL via INTRAVENOUS

## 2015-03-18 MED ORDER — IOHEXOL 300 MG/ML  SOLN
25.0000 mL | Freq: Once | INTRAMUSCULAR | Status: AC | PRN
  Administered 2015-03-18: 50 mL via ORAL

## 2015-03-18 MED ORDER — ONDANSETRON HCL 4 MG/2ML IJ SOLN: 4.0000 mg | Freq: Three times a day (TID) | INTRAMUSCULAR | Status: DC | PRN

## 2015-03-18 MED ORDER — METOCLOPRAMIDE HCL 5 MG/ML IJ SOLN
10.0000 mg | Freq: Once | INTRAMUSCULAR | Status: AC
  Administered 2015-03-18: 10 mg via INTRAVENOUS
  Filled 2015-03-18: qty 2

## 2015-03-18 MED ORDER — PANTOPRAZOLE SODIUM 40 MG IV SOLR
40.0000 mg | Freq: Every day | INTRAVENOUS | Status: DC
  Administered 2015-03-18: 40 mg via INTRAVENOUS
  Filled 2015-03-18: qty 40

## 2015-03-18 MED ORDER — ZOLPIDEM TARTRATE 5 MG PO TABS
5.0000 mg | ORAL_TABLET | Freq: Every evening | ORAL | Status: DC | PRN
  Administered 2015-03-20: 5 mg via ORAL
  Filled 2015-03-18: qty 1

## 2015-03-18 MED ORDER — PIPERACILLIN-TAZOBACTAM 3.375 G IVPB 30 MIN
3.3750 g | Freq: Once | INTRAVENOUS | Status: AC
  Administered 2015-03-18: 3.375 g via INTRAVENOUS
  Filled 2015-03-18: qty 50

## 2015-03-18 NOTE — ED Provider Notes (Signed)
CSN: [id number]     Arrival date & time 03/31/2015  1338 History   First MD Initiated Contact with Patient 04/01/2015 1358     Chief Complaint  Patient presents with  . Pleurisy     (Consider location/radiation/quality/duration/timing/severity/associated sxs/prior Treatment) HPI patient has had increasing weakness and fatigue. He reports he's had much difficulty eating or drinking. Does get pain in his left chest although that has been chronic in nature. He reports the swelling in his left arm is improved relative to previously. He reports the swelling in his legs is about the same as it has been. Patient reports he knows he does have very poor prognosis with his cancer. He reports he is taking care of a nursing home but he has been feeling much worse over the past 2-3 days. Past Medical History  Diagnosis Date  . Type II diabetes mellitus ([place])   . Hyperlipemia     Archie Endo 11/07/2014  . Hypertension     Archie Endo 11/07/2014  . Kidney stones   . 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 Liver cancer ([place]) dx'd 08/2014  . Macular degeneration, bilateral   . GERD (gastroesophageal reflux disease)   . Sleep apnea     "suppose to use a mask but I haven't in awhile" (11/07/2014)   Past Surgical History  Procedure Laterality Date  . Abdominal exploration surgery  08/2014    attempted whipple per wife -but unable to resect /notes 11/07/2014  . Bile duct stent placement  09/2014    Archie Endo 11/07/2014  . Cholecystectomy open  08/2014   Family History  Problem Relation Age of Onset  . Diabetes Mother   . Diabetes Father   . Heart disease Father   . Emphysema Father    Social History  Substance Use Topics  . Smoking status: Never Smoker   . Smokeless tobacco: Never Used  . Alcohol Use: No    Review of Systems 10 Systems reviewed and are negative for acute change except as noted in the HPI.    Allergies  Codeine and Other  Home Medications   Prior to  Admission medications   Medication Sig Start Date End Date Taking? Authorizing Provider  cyclobenzaprine (FLEXERIL) 10 MG tablet Take 5 mg by mouth at bedtime as needed for muscle spasms. Muscle spasms   Yes Historical Provider, MD  feeding supplement, GLUCERNA SHAKE, (GLUCERNA SHAKE) LIQD Take 237 mLs by mouth 3 (three) times daily between meals. 03/09/15  Yes Venetia Maxon Rama, MD  ferrous sulfate 325 (65 FE) MG tablet Take 1 tablet (325 mg total) by mouth 2 (two) times daily with a meal. 03/09/15  Yes Christina P Rama, MD  fluticasone (FLONASE) 50 MCG/ACT nasal spray Place 1 spray into both nostrils daily as needed for allergies or rhinitis.    Yes Historical Provider, MD  insulin aspart (NOVOLOG) 100 UNIT/ML injection Inject 0-15 Units into the skin 3 (three) times daily with meals. 03/09/15  Yes Christina P Rama, MD  insulin aspart (NOVOLOG) 100 UNIT/ML injection Inject 0-5 Units into the skin at bedtime. 03/09/15  Yes Venetia Maxon Rama, MD  insulin glargine (LANTUS) 100 UNIT/ML injection Inject 0.05 mLs (5 Units total) into the skin at bedtime. 03/09/15  Yes Venetia Maxon Rama, MD  magic mouthwash SOLN Take 5 mLs by mouth 4 (four) times daily. 03/10/15 03/26/2015 Yes Historical Provider, MD  nystatin (MYCOSTATIN) 100000 UNIT/ML suspension Take 6 mLs by mouth 3 (three) times daily.  Swish and swallow 6cc   Yes Historical Provider, MD  ondansetron (ZOFRAN) 8 MG tablet Take 8 mg by mouth every 8 (eight) hours as needed for nausea or vomiting.    Yes Historical Provider, MD  oxycodone (OXY-IR) 5 MG capsule Take 1 capsule (5 mg total) by mouth every 4 (four) hours as needed for pain. 03/09/15  Yes Christina P Rama, MD  pantoprazole (PROTONIX) 40 MG tablet Take 40 mg by mouth daily as needed (heartburn).    Yes Historical Provider, MD   BP 94/63 mmHg  Pulse 112  Resp 20  SpO2 96% Physical Exam  Constitutional:  A she is very deconditioned and pale in appearance.  HENT:  Head: Normocephalic and  atraumatic.  MM are dry.  Eyes: EOM are normal.  Cardiovascular:  Tachycardia. Heart sounds are heard best centrally and her soft tissue of the left lateral.  Pulmonary/Chest:  Mild increased work of breathing. She has occasional coarse rhonchi. Pleurx catheters present on the left chest wall. Dressing is clean dry and intact.  Abdominal: Soft. He exhibits distension. There is no tenderness.  Musculoskeletal: He exhibits edema.  Patient has edema both the left upper extremity and bilateral lower extremity's. Calves are nontender.  Neurological:  Patient is fatigued but is alert and appropriate.  Skin: Skin is warm and dry. There is pallor.    ED Course  Procedures (including critical care time) Labs Review Labs Reviewed  COMPREHENSIVE METABOLIC PANEL - Abnormal; Notable for the following:    Potassium 5.4 (*)    CO2 18 (*)    Glucose, Bld 170 (*)    BUN 53 (*)    Creatinine, Ser 2.02 (*)    Calcium 7.8 (*)    Total Protein 4.6 (*)    Albumin 1.6 (*)    AST 52 (*)    Alkaline Phosphatase 909 (*)    Total Bilirubin 2.6 (*)    GFR calc non Af Amer 32 (*)    GFR calc Af Amer 38 (*)    Anion gap 17 (*)    All other components within normal limits  BRAIN NATRIURETIC PEPTIDE - Abnormal; Notable for the following:    B Natriuretic Peptide 113.0 (*)    All other components within normal limits  TROPONIN I - Abnormal; Notable for the following:    Troponin I 0.71 (*)    All other components within normal limits  CBC WITH DIFFERENTIAL/PLATELET - Abnormal; Notable for the following:    WBC 24.8 (*)    RBC 2.85 (*)    Hemoglobin 8.3 (*)    HCT 26.5 (*)    RDW 19.1 (*)    Neutro Abs 22.6 (*)    All other components within normal limits  PROTIME-INR - Abnormal; Notable for the following:    Prothrombin Time 15.8 (*)    All other components within normal limits  CULTURE, BLOOD (ROUTINE X 2)  CULTURE, BLOOD (ROUTINE X 2)  LIPASE, BLOOD  URINALYSIS, ROUTINE W REFLEX MICROSCOPIC  (NOT AT [facility])  I-STAT CG4 LACTIC ACID, ED  TYPE AND SCREEN    Imaging Review Dg Chest Port 1 View  03/23/2015  CLINICAL DATA:  70 year old male with chronic lung pain and stage IV lung cancer EXAM: PORTABLE CHEST 1 VIEW COMPARISON:  Prior chest x-ray 03/12/2015 FINDINGS: Right IJ approach single-lumen power injectable port catheter. Catheter tip overlies the superior cavoatrial junction. The patient is rotated to the left. There is chronic volume loss with right-to-left shift of the  cardiac and mediastinal structures. Similar appearance of the left lung with a small portion of aerated left upper lobe. The remainder of the lung is consolidated. Chronic bronchitic change and interstitial prominence noted throughout the aerated right lung. No acute osseous abnormality. IMPRESSION: Stable chest x-ray without evidence of acute cardiopulmonary process. Electronically Signed   By: Jacqulynn Cadet M.D.   On: 04/01/2015 14:40   I have personally reviewed and evaluated these images and lab results as part of my medical decision-making.   EKG Interpretation None     Consult: Patient's case is reviewed Dr. Sonny Dandy oncology. Consult with hospitalist for admission. 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 He is acutely dehydrated with renal insufficiency. Patient also a significant leukocytosis without evidence of granulocyte colony stimulant effect administration. He will be admitted for ongoing treatment and appear treatment for possible pneumonia versus other infectious source.    Charlesetta Shanks, MD 04/05/2015 [phone]

## 2015-03-18 NOTE — ED Notes (Signed)
Patient belongings sent with the patient.

## 2015-03-18 NOTE — H&P (Signed)
Patient Demographics  Manuel Barr, is a 70 y.o. 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 Source of sepsis is not clear at this point, but possibly healthcare associated pneumonia-UA/urine culture pending, chest x-ray shows "Slight improved aeration of the left lung without other significant change. Continued left lower lung airspace disease/ consolidation and left pleural effusion", ? Cholangitis. Patient will be admitted to telemetry for further management including septic workup-CT chest abdomen and pelvis without contrast, blood culture, UA/urine culture. Meanwhile, he will receive IV fluids(expect acute kidney injury/lactic acidosis/hyperkalemia to improve with hydration), empiric antibiotics and anti-emetics as necessary. Patient unfortunately in severe discomfort and not able to provide much history to go with. We will need to evaluate the history as he gets better, including addressing goals of care etc. He is full code for now. Plan Sepsis (HCC)/Leukocytosis/Acute kidney injury (HCC)/Lactic acidosis/Hyperkalemia/HCAP (healthcare-associated pneumonia)  Admit to telemetry  Septic workup-UA/urine culture/blood culture  CT chest abdomen and pelvis without contrast  IV fluids/Vancomycin/Zosyn to cover for  nosocomial organisms including MRSA/Pseudomonas as well as anaerobes Duodenal cancer (HCC)/Pulmonary embolism (HCC)/Iron deficiency anemia due to chronic blood loss  No anticoagulation as bleeding risk. Fortunately has IVC filter.  PRBC transfusion if hemoglobin drops below 8 g/dl  PPI Diabetes mellitus, type 2 (HCC)  Check hemoglobin A1c  Lantus/SSI Migraine/essential HTN (hypertension)  Monitor Protein-calorie malnutrition, severe  Glucerna DVT/GI Prophylaxis  SCDs  PPI Family Communication: No family members at bedside. Will call in a.m.  Code Status   Full code  Likely DC    Condition closely GUARDED    Time spent in minutes : 43  Chief Complaint Chief Complaint  Patient presents with  . Pleurisy     HPI [name]  is a 69 y.o. male, who came in from skilled nursing facility with complaints of not feeling well associated with poor appetite and cough. Patient hospitalized last month with pneumonia. He denies diarrhea. No abdominal pain. Does not give much other history as he is in discomfort. He would like for me to leave him alone and come back later when he feels better.   Review of Systems   As in the HPI above.   Past Medical History  Diagnosis Date  . Type II diabetes mellitus ([place])   . Hyperlipemia     Archie Endo 11/07/2014  . Hypertension     Archie Endo 11/07/2014  . Kidney stones   . 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 Liver cancer ([place]) dx'd 08/2014  . Macular degeneration, bilateral   . GERD (gastroesophageal reflux disease)   . Sleep apnea     "suppose to use a mask but I haven't in awhile" (11/07/2014)      Past Surgical History  Procedure Laterality Date  . Abdominal exploration surgery  08/2014    attempted whipple per wife -but unable to resect /  notes 11/07/2014  . Bile duct stent placement  09/2014    Archie Endo 11/07/2014  . Cholecystectomy open  08/2014    Social History Social History  Substance  Use Topics  . Smoking status: Never Smoker   . Smokeless tobacco: Never Used  . Alcohol Use: No    Family History Family History  Problem Relation Age of Onset  . Diabetes Mother   . Diabetes Father   . Heart disease Father   . Emphysema Father     Prior to Admission medications   Medication Sig Start Date End Date Taking? Authorizing Provider  cyclobenzaprine (FLEXERIL) 10 MG tablet Take 5 mg by mouth at bedtime as needed for muscle spasms. Muscle spasms   Yes Historical Provider, MD  feeding supplement, GLUCERNA SHAKE, (GLUCERNA SHAKE) LIQD Take 237 mLs by mouth 3 (three) times daily between meals. 03/09/15  Yes Venetia Maxon Rama, MD  ferrous sulfate 325 (65 FE) MG tablet Take 1 tablet (325 mg total) by mouth 2 (two) times daily with a meal. 03/09/15  Yes Christina P Rama, MD  fluticasone (FLONASE) 50 MCG/ACT nasal spray Place 1 spray into both nostrils daily as needed for allergies or rhinitis.    Yes Historical Provider, MD  insulin aspart (NOVOLOG) 100 UNIT/ML injection Inject 0-15 Units into the skin 3 (three) times daily with meals. 03/09/15  Yes Christina P Rama, MD  insulin aspart (NOVOLOG) 100 UNIT/ML injection Inject 0-5 Units into the skin at bedtime. 03/09/15  Yes Venetia Maxon Rama, MD  insulin glargine (LANTUS) 100 UNIT/ML injection Inject 0.05 mLs (5 Units total) into the skin at bedtime. 03/09/15  Yes Venetia Maxon Rama, MD  magic mouthwash SOLN Take 5 mLs by mouth 4 (four) times daily. 03/10/15 04/05/2015 Yes Historical Provider, MD  nystatin (MYCOSTATIN) 100000 UNIT/ML suspension Take 6 mLs by mouth 3 (three) times daily. Swish and swallow 6cc   Yes Historical Provider, MD  ondansetron (ZOFRAN) 8 MG tablet Take 8 mg by mouth every 8 (eight) hours as needed for nausea or vomiting.    Yes Historical Provider, MD  oxycodone (OXY-IR) 5 MG capsule Take 1 capsule (5 mg total) by mouth every 4 (four) hours as needed for pain. 03/09/15  Yes Christina P Rama, MD  pantoprazole (PROTONIX)  40 MG tablet Take 40 mg by mouth daily as needed (heartburn).    Yes Historical Provider, MD    Allergies  Allergen Reactions  . Codeine Nausea And Vomiting  . Other     Pt is on a Mechanical Soft Diet, has a stint in stomach.    Physical Exam  Vitals  Blood pressure 100/61, pulse 106, temperature 98.2 F (36.8 C), temperature source Oral, resp. rate 16, height 5\' 2"  (1.575 m), weight 66.6 kg (146 lb 13.2 oz), SpO2 95 %.   1. General: lying in bed in discomfort. Ill-looking.  2. Normal affect and insight, Not Suicidal or Homicidal, Awake Alert, Oriented X 3.  3. No F.N deficits, ALL C.Nerves Intact, Strength 5/5 all 4 extremities, Sensation intact all 4 extremities, Plantars down going.  4. Ears and Eyes appear Normal, Conjunctivae clear, PERRLA. Dry Oral Mucosa.  5. Supple Neck, No JVD, No cervical lymphadenopathy appriciated, No Carotid Bruits.  6. Symmetrical Chest wall movement, Good air movement bilaterally, CTAB.  7. Regular tachycardia, No Gallops, Rubs or Murmurs, No Parasternal Heave.  8. Positive Bowel Sounds, Abdomen Soft, Non tender, No organomegaly appriciated,No rebound -guarding or rigidity.  9.  No Cyanosis, Normal Skin Turgor, No  Skin Rash or Bruise.  10. Good muscle tone,  joints appear normal , no effusions, Normal ROM.  11. No Palpable Lymph Nodes in Neck or Axillae  Data Review CBC  Recent Labs Lab 03/12/15 1144 04/07/2015 1455  WBC 9.9 24.8*  HGB 10.1* 8.3*  HCT 31.4* 26.5*  PLT 131* 151  MCV 87.2 93.0  MCH 28.1 29.1  MCHC 32.2 31.3  RDW 17.9* 19.1*  LYMPHSABS 0.9 1.5  MONOABS 0.8 0.7  EOSABS 0.1 0.0  BASOSABS 0.0 0.0   ------------------------------------------------------------------------------------------------------------------  Chemistries   Recent Labs Lab 03/12/15 1144 03/16/2015 1455  NA 135 138  K 4.2 5.4*  CL 103 103  CO2 24 18*  GLUCOSE 148* 170*  BUN 15 53*  CREATININE 0.96 2.02*  CALCIUM 7.6* 7.8*  AST  --   52*  ALT  --  38  ALKPHOS  --  909*  BILITOT  --  2.6*   ------------------------------------------------------------------------------------------------------------------ estimated creatinine clearance is 29.8 mL/min (by C-G formula based on Cr of 2.02). ------------------------------------------------------------------------------------------------------------------ No results for input(s): TSH, T4TOTAL, T3FREE, THYROIDAB in the last 72 hours.  Invalid input(s): FREET3   Coagulation profile  Recent Labs Lab 03/21/2015 1455  INR 1.24   ------------------------------------------------------------------------------------------------------------------- No results for input(s): DDIMER in the last 72 hours. -------------------------------------------------------------------------------------------------------------------  Cardiac Enzymes  Recent Labs Lab 03/13/2015 1455  TROPONINI 0.71*   ------------------------------------------------------------------------------------------------------------------ Invalid input(s): POCBNP   ---------------------------------------------------------------------------------------------------------------  Urinalysis No results found for: COLORURINE, APPEARANCEUR, LABSPEC, PHURINE, GLUCOSEU, HGBUR, BILIRUBINUR, KETONESUR, PROTEINUR, UROBILINOGEN, NITRITE, LEUKOCYTESUR  ----------------------------------------------------------------------------------------------------------------  Imaging results  Dg Chest 2 View  03/12/2015  CLINICAL DATA:  70 year old male with shortness of breath. History of duodenal and liver cancer. EXAM: CHEST  2 VIEW COMPARISON:  03/05/2015 FINDINGS: The cardiomediastinal silhouette is unchanged. Continued consolidation/airspace disease within the left lung with left pleural effusion again noted with slight improved aeration of the left lung. A right sided Port-A-Cath is again noted with tip overlying the superior cavoatrial  junction. There is no evidence of pneumothorax. IMPRESSION: Slight improved aeration of the left lung without other significant change. Continued left lower lung airspace disease/ consolidation and left pleural effusion. Electronically Signed   By: Margarette Canada M.D.   On: 03/12/2015 10:49   Dg Chest 2 View  03/05/2015  CLINICAL DATA:  Worsening shortness of breath for 2 days. History of duodenum and liver cancer. Pleural effusions. EXAM: CHEST  2 VIEW COMPARISON:  None available FINDINGS: Right IJ port catheter tip SVC RA junction. Extensive consolidative airspace process throughout the left lung with central air bronchograms. Underlying left lung mass not excluded. Blunting of the left costophrenic angle compatible with a left effusion. No pneumothorax. Right lung remains relatively clear. Small right midline nodule appears calcified, suspicious for granulomatous disease. No right effusion. Trachea is midline. Biliary and duodenal stents noted. No acute osseous finding. IMPRESSION: Extensive consolidative airspace process throughout the left lung compatible with underlying known lung mass and likely superimposed left lung pneumonia/pneumonitis. Associated small to moderate left effusion Other chronic and postoperative findings as above. Electronically Signed   By: Jerilynn Mages.  Shick M.D.   On: 03/05/2015 15:06   Ir Ivc Filter Plmt / S&i /img Guid/mod Sed  03/07/2015  CLINICAL DATA:  Recent pulmonary embolism. History of duodenal adenocarcinoma and current GI bleed with contraindication to continued anticoagulation. Request has been made to place an IVC filter. EXAM: 1. ULTRASOUND GUIDANCE FOR VASCULAR ACCESS OF THE RIGHT INTERNAL JUGULAR VEIN. 2. IVC VENOGRAM. 3. PERCUTANEOUS IVC FILTER PLACEMENT. ANESTHESIA/SEDATION: 2.5  mg IV Versed; 50 mcg IV Fentanyl. Total Moderate Sedation Time 14 minutes. CONTRAST:  29 mL Omnipaque 300 FLUOROSCOPY TIME:  1 minute and 30 seconds. PROCEDURE: The procedure, risks, benefits, and  alternatives were explained to the patient. Questions regarding the procedure were encouraged and answered. The patient understands and consents to the procedure. The right neck was prepped with Betadine in a sterile fashion, and a sterile drape was applied covering the operative field. A sterile gown and sterile gloves were used for the procedure. Local anesthesia was provided with 1% Lidocaine. Ultrasound was used to confirm patency of the right internal jugular vein. Under direct ultrasound guidance, a 21 gauge needle was advanced into the right internal jugular vein with ultrasound image documentation performed. After securing access with a micropuncture dilator, a guidewire was advanced into the inferior vena cava. A deployment sheath was advanced over the guidewire. This was utilized to perform IVC venography. The deployment sheath was further positioned in an appropriate location for filter deployment. A Bard Denali IVC filter was then advanced in the sheath. This was then fully deployed in the infrarenal IVC. Final filter position was confirmed with a fluoroscopic spot image. Contrast injection was also performed through the sheath under fluoroscopy to confirm patency of the IVC at the level of the filter. After the procedure the sheath was removed and hemostasis obtained with manual compression. COMPLICATIONS: None. FINDINGS: IVC venography demonstrates a normal caliber IVC with no evidence of thrombus. Renal veins are identified bilaterally. There is a fairly low positioned right renal veins/ accessory right renal vein at the level of the superior endplate of L2. The IVC filter was successfully positioned below the level of the renal veins and is appropriately oriented. This IVC filter has both permanent and retrievable indications. IMPRESSION: Placement of percutaneous IVC filter in infrarenal IVC. IVC venogram shows no evidence of IVC thrombus and normal caliber of the inferior vena cava. This filter  does have both permanent and retrievable indications. Electronically Signed   By: Aletta Edouard M.D.   On: 03/07/2015 17:06   Dg Chest Port 1 View  03/19/2015  CLINICAL DATA:  70 year old male with chronic lung pain and stage IV lung cancer EXAM: PORTABLE CHEST 1 VIEW COMPARISON:  Prior chest x-ray 03/12/2015 FINDINGS: Right IJ approach single-lumen power injectable port catheter. Catheter tip overlies the superior cavoatrial junction. The patient is rotated to the left. There is chronic volume loss with right-to-left shift of the cardiac and mediastinal structures. Similar appearance of the left lung with a small portion of aerated left upper lobe. The remainder of the lung is consolidated. Chronic bronchitic change and interstitial prominence noted throughout the aerated right lung. No acute osseous abnormality. IMPRESSION: Stable chest x-ray without evidence of acute cardiopulmonary process. Electronically Signed   By: Jacqulynn Cadet M.D.   On: 03/21/2015 14:40        Milyn Stapleton M.D on 03/24/2015 at 7:23 PM  Between 7am to 7pm - Pager - [phone]  After 7pm go to [email or web address] - password [email or web address]  And look for the night coverage person covering me after hours  [facility] Office  [phone]

## 2015-03-18 NOTE — Progress Notes (Addendum)
ANTIBIOTIC CONSULT NOTE - INITIAL  Pharmacy Consult for Cefepime Indication: HCAP  Allergies  Allergen Reactions  . Codeine Nausea And Vomiting  . Other     Pt is on a Mechanical Soft Diet, has a stint in stomach.    Patient Measurements: Height: 66 inches TBW: 68 kg   Vital Signs: BP: 94/63 mmHg (01/07 1556) Pulse Rate: 112 (01/07 1556) Intake/Output from previous day:   Intake/Output from this shift: Total I/O In: 500 [IV Piggyback:500] Out: -   Labs:  Recent Labs  03/20/2015 1455  WBC 24.8*  HGB 8.3*  PLT 151  CREATININE 2.02*   Estimated Creatinine Clearance: 32 mL/min (by C-G formula based on Cr of 2.02). No results for input(s): VANCOTROUGH, VANCOPEAK, VANCORANDOM, GENTTROUGH, GENTPEAK, GENTRANDOM, TOBRATROUGH, TOBRAPEAK, TOBRARND, AMIKACINPEAK, AMIKACINTROU, AMIKACIN in the last 72 hours.   Microbiology: Recent Results (from the past 720 hour(s))  Culture, blood (routine x 2) Call MD if unable to obtain prior to antibiotics being given     Status: None   Collection Time: 03/05/15 10:45 PM  Result Value Ref Range Status   Specimen Description BLOOD LEFT ARM  Final   Special Requests BOTTLES DRAWN AEROBIC ONLY 5CC  Final   Culture   Final    NO GROWTH 5 DAYS Performed at [facility]    Report Status 03/11/2015 FINAL  Final  Culture, blood (routine x 2) Call MD if unable to obtain prior to antibiotics being given     Status: None   Collection Time: 03/05/15 10:50 PM  Result Value Ref Range Status   Specimen Description BLOOD RIGHT ARM  Final   Special Requests BOTTLES DRAWN AEROBIC AND ANAEROBIC 5CC  Final   Culture   Final    NO GROWTH 5 DAYS Performed at [facility]    Report Status 03/11/2015 FINAL  Final    Medical History: Past Medical History  Diagnosis Date  . Type II diabetes mellitus ([place])   . Hyperlipemia     Archie Endo 11/07/2014  . Hypertension     Archie Endo 11/07/2014  . Kidney stones   . 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 Liver cancer ([place]) dx'd 08/2014  . Macular degeneration, bilateral   . GERD (gastroesophageal reflux disease)   . Sleep apnea     "suppose to use a mask but I haven't in awhile" (11/07/2014)    Medications:  Scheduled:   Infusions:  . 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 Cefepime to be started for HCAP. Vancomycin 1gm IV x 1 ordered in ER.  Goal of Therapy:  Eradication of infection Appropriate antibiotic dosing for indication and renal function.  Plan:  Will start Cefepime 1gm IV Q12h  Will f/u Scr and adjust dose as needed Will f/u Blood Cx  Garnet Sierras 04/04/2015,4:12 PM   Addendum: Suzie Portela to be added to regimen. Per ER RN, Vanco 1gm was given @4pm . Will give Vancomycin 1gm IV Q24h starting tomorrow. Vanco trough goal= 15-20 mcg/ml. Will f/u Scr and adjust dose as needed. Cefepime d/ced and changed to Zosyn.  Garnet Sierras, PharmD 03/28/2015 @ [phone]

## 2015-03-18 NOTE — ED Notes (Signed)
MD at bedside. 

## 2015-03-18 NOTE — ED Notes (Signed)
Patient to go up to unit at 1730.

## 2015-03-18 NOTE — ED Notes (Signed)
Bed: [id number] Expected date: 03/26/2015 Expected time: 1:36 PM Means of arrival: Ambulance Comments: Lung CA pt

## 2015-03-18 NOTE — ED Notes (Addendum)
CRITICAL LAB VALUE: Troponin is 0.71     Spoke with Sarah from lab. Relayed message to [place].

## 2015-03-18 NOTE — ED Notes (Addendum)
Patient is from [place] with a complaint of chronic "lung pain". Patient has stage 4 lung cancer and was medicated with oxycodone 5mg  at the facility and the patient was not satisfied with the result. However he rates his pain as a 3. Patient has multiple complaints of dehydration, back pain, and blurred vision due to dehydration.

## 2015-03-18 NOTE — ED Notes (Signed)
Patient has port placed at [facility] and drain to LLQ of abdomen.

## 2015-03-18 NOTE — ED Notes (Signed)
Patient unable to void at this time

## 2015-03-19 LAB — COMPREHENSIVE METABOLIC PANEL
ALT: 33 U/L (ref 17–63)
AST: 47 U/L — AB (ref 15–41)
Albumin: 1.3 g/dL — ABNORMAL LOW (ref 3.5–5.0)
Alkaline Phosphatase: 703 U/L — ABNORMAL HIGH (ref 38–126)
Anion gap: 10 (ref 5–15)
BILIRUBIN TOTAL: 1.9 mg/dL — AB (ref 0.3–1.2)
BUN: 58 mg/dL — AB (ref 6–20)
CO2: 21 mmol/L — ABNORMAL LOW (ref 22–32)
CREATININE: 2.16 mg/dL — AB (ref 0.61–1.24)
Calcium: 7 mg/dL — ABNORMAL LOW (ref 8.9–10.3)
Chloride: 105 mmol/L (ref 101–111)
GFR, EST AFRICAN AMERICAN: 35 mL/min — AB (ref >60)
GFR, EST NON AFRICAN AMERICAN: 30 mL/min — AB (ref >60)
Glucose, Bld: 125 mg/dL — ABNORMAL HIGH (ref 65–99)
POTASSIUM: 4.7 mmol/L (ref 3.5–5.1)
Sodium: 136 mmol/L (ref 135–145)
TOTAL PROTEIN: 3.9 g/dL — AB (ref 6.5–8.1)

## 2015-03-19 LAB — URINALYSIS, ROUTINE W REFLEX MICROSCOPIC
GLUCOSE, UA: NEGATIVE mg/dL
Hgb urine dipstick: NEGATIVE
KETONES UR: NEGATIVE mg/dL
Leukocytes, UA: NEGATIVE
Nitrite: NEGATIVE
PH: 5 (ref 5.0–8.0)
Protein, ur: NEGATIVE mg/dL
SPECIFIC GRAVITY, URINE: 1.03 (ref 1.005–1.030)

## 2015-03-19 LAB — C DIFFICILE QUICK SCREEN W PCR REFLEX
C DIFFICILE (CDIFF) INTERP: POSITIVE
C Diff antigen: POSITIVE — AB
C Diff toxin: POSITIVE — AB

## 2015-03-19 LAB — GLUCOSE, CAPILLARY
GLUCOSE-CAPILLARY: 126 mg/dL — AB (ref 65–99)
GLUCOSE-CAPILLARY: 105 mg/dL — AB (ref 65–99)
GLUCOSE-CAPILLARY: 107 mg/dL — AB (ref 65–99)
Glucose-Capillary: 178 mg/dL — ABNORMAL HIGH (ref 65–99)
Glucose-Capillary: 110 mg/dL — ABNORMAL HIGH (ref 65–99)

## 2015-03-19 LAB — CBC WITH DIFFERENTIAL/PLATELET
BASOS ABS: 0 10*3/uL (ref 0.0–0.1)
Basophils Relative: 0 %
EOS PCT: 1 %
Eosinophils Absolute: 0.3 10*3/uL (ref 0.0–0.7)
HCT: 25.1 % — ABNORMAL LOW (ref 39.0–52.0)
Hemoglobin: 7.9 g/dL — ABNORMAL LOW (ref 13.0–17.0)
LYMPHS ABS: 1.2 10*3/uL (ref 0.7–4.0)
Lymphocytes Relative: 4 %
MCH: 29.9 pg (ref 26.0–34.0)
MCHC: 31.5 g/dL (ref 30.0–36.0)
MCV: 95.1 fL (ref 78.0–100.0)
MONO ABS: 2.1 10*3/uL — AB (ref 0.1–1.0)
Monocytes Relative: 7 %
NEUTROS ABS: 25.7 10*3/uL — AB (ref 1.7–7.7)
Neutrophils Relative %: 88 %
PLATELETS: 134 10*3/uL — AB (ref 150–400)
RBC: 2.64 MIL/uL — AB (ref 4.22–5.81)
RDW: 19.1 % — AB (ref 11.5–15.5)
WBC: 29.3 10*3/uL — AB (ref 4.0–10.5)

## 2015-03-19 LAB — MAGNESIUM: Magnesium: 1.8 mg/dL (ref 1.7–2.4)

## 2015-03-19 LAB — PHOSPHORUS: PHOSPHORUS: 4.4 mg/dL (ref 2.5–4.6)

## 2015-03-19 LAB — PREPARE RBC (CROSSMATCH)

## 2015-03-19 LAB — PROTIME-INR
INR: 1.26 (ref 0.00–1.49)
Prothrombin Time: 16 seconds — ABNORMAL HIGH (ref 11.6–15.2)

## 2015-03-19 LAB — APTT: APTT: 31 s (ref 24–37)

## 2015-03-19 LAB — TSH: TSH: 7.052 u[IU]/mL — ABNORMAL HIGH (ref 0.350–4.500)

## 2015-03-19 LAB — T4, FREE: FREE T4: 0.69 ng/dL (ref 0.61–1.12)

## 2015-03-19 MED ORDER — METRONIDAZOLE IN NACL 5-0.79 MG/ML-% IV SOLN
500.0000 mg | Freq: Three times a day (TID) | INTRAVENOUS | Status: DC
  Administered 2015-03-19 (×2): 500 mg via INTRAVENOUS
  Filled 2015-03-19 (×2): qty 100

## 2015-03-19 MED ORDER — NOREPINEPHRINE BITARTRATE 1 MG/ML IV SOLN
0.0000 ug/min | INTRAVENOUS | Status: DC
  Administered 2015-03-19 (×2): 2 ug/min via INTRAVENOUS
  Administered 2015-03-20: 6 ug/min via INTRAVENOUS
  Administered 2015-03-21: 18 ug/min via INTRAVENOUS
  Administered 2015-03-21: 17 ug/min via INTRAVENOUS
  Administered 2015-03-21: 6 ug/min via INTRAVENOUS
  Administered 2015-03-22: 20 ug/min via INTRAVENOUS
  Administered 2015-03-22 (×2): 19 ug/min via INTRAVENOUS
  Filled 2015-03-19 (×10): qty 4

## 2015-03-19 MED ORDER — ALBUMIN HUMAN 25 % IV SOLN
12.5000 g | Freq: Once | INTRAVENOUS | Status: AC
  Administered 2015-03-19: 12.5 g via INTRAVENOUS
  Filled 2015-03-19 (×2): qty 50

## 2015-03-19 MED ORDER — SODIUM CHLORIDE 0.9 % IJ SOLN: 10.0000 mL | INTRAMUSCULAR | Status: DC | PRN

## 2015-03-19 MED ORDER — LIP MEDEX EX OINT
TOPICAL_OINTMENT | CUTANEOUS | Status: AC
Start: 2015-03-19 — End: 2015-03-19
  Administered 2015-03-19: 21:00:00
  Filled 2015-03-19: qty 7

## 2015-03-19 MED ORDER — PANTOPRAZOLE SODIUM 40 MG IV SOLR
40.0000 mg | Freq: Two times a day (BID) | INTRAVENOUS | Status: DC
  Administered 2015-03-19 – 2015-03-20 (×3): 40 mg via INTRAVENOUS
  Filled 2015-03-19 (×3): qty 40

## 2015-03-19 MED ORDER — SODIUM CHLORIDE 0.9 % IV BOLUS (SEPSIS)
500.0000 mL | Freq: Once | INTRAVENOUS | Status: AC
  Administered 2015-03-19: 500 mL via INTRAVENOUS

## 2015-03-19 MED ORDER — SODIUM CHLORIDE 0.9 % IV BOLUS (SEPSIS): 500.0000 mL | Freq: Once | INTRAVENOUS | Status: DC

## 2015-03-19 MED ORDER — VITAMIN K1 10 MG/ML IJ SOLN
5.0000 mg | Freq: Once | INTRAMUSCULAR | Status: AC
  Administered 2015-03-19: 5 mg via SUBCUTANEOUS
  Filled 2015-03-19: qty 0.5

## 2015-03-19 MED ORDER — VANCOMYCIN 50 MG/ML ORAL SOLUTION
125.0000 mg | Freq: Four times a day (QID) | ORAL | Status: DC
  Administered 2015-03-19 – 2015-03-21 (×5): 125 mg via ORAL
  Filled 2015-03-19 (×13): qty 2.5

## 2015-03-19 MED ORDER — LEVOTHYROXINE SODIUM 100 MCG IV SOLR
12.5000 ug | Freq: Every day | INTRAVENOUS | Status: DC
  Administered 2015-03-19 – 2015-03-21 (×3): 12.5 ug via INTRAVENOUS
  Filled 2015-03-19 (×3): qty 5

## 2015-03-19 MED ORDER — ONDANSETRON HCL 4 MG/2ML IJ SOLN: 4.0000 mg | Freq: Four times a day (QID) | INTRAMUSCULAR | Status: DC | PRN

## 2015-03-19 MED ORDER — LIP MEDEX EX OINT
TOPICAL_OINTMENT | CUTANEOUS | Status: DC | PRN
  Administered 2015-03-20: 10:00:00 via TOPICAL

## 2015-03-19 MED ORDER — SODIUM CHLORIDE 0.9 % IV SOLN
Freq: Once | INTRAVENOUS | Status: AC
  Administered 2015-03-19: 13:00:00 via INTRAVENOUS

## 2015-03-19 MED ORDER — HYDROMORPHONE HCL 1 MG/ML IJ SOLN
0.5000 mg | INTRAMUSCULAR | Status: DC | PRN
  Administered 2015-03-20 – 2015-03-21 (×2): 0.5 mg via INTRAVENOUS
  Filled 2015-03-19 (×2): qty 1

## 2015-03-19 NOTE — Progress Notes (Signed)
Report called and given to Ginger, rn in the ICU. Pt transferred to stepdown. Wife, Seth Bake, called by this rn and made aware of plan of care. Vwilliams,rn.

## 2015-03-19 NOTE — Progress Notes (Signed)
Pt with frequent loose stools in moderate to large amounts. MASD noted to rectum and perineum.  MD made aware and this writer requested an order for a flexiseal. Order given. Vwilliams, rn.

## 2015-03-19 NOTE — Progress Notes (Addendum)
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 Source of sepsis is not clear at this point, but possibly healthcare associated pneumonia-UA/urine culture pending, chest x-ray shows "Slight improved aeration of the left lung without other significant change. Continued left lower lung airspace disease/ consolidation and left pleural effusion", ? Cholangitis. Patient will be admitted to telemetry for further management including septic workup-CT chest abdomen and pelvis without contrast, blood culture, UA/urine culture. Meanwhile, he will receive IV fluids(expect acute kidney injury/lactic acidosis/hyperkalemia to improve with hydration), empiric antibiotics and anti-emetics as necessary. Patient unfortunately in severe discomfort and not able to provide much history to go with. We will need to evaluate the history as he gets better, including addressing goals of care etc. He is full code for now. Subsequent developments after admission 03/19/15: Blood pressure remained low despite IV fluids/antibiotics. Patient noted to have loose bowel movements with melena. Hemoglobin dropped to 7.9g/dl. Bun/cr is 53/2.02. White count has increased to 29,300. TSH elevated at 7.05. CT chest abdomen pelvis shows"1. Complex fluid collection  in the left hemi thorax consistent with an empyema. There is likely associated necrotic pneumonia. Additional left lung consolidation is likely combination of infection and atelectasis. 2. Moderate right pleural effusion. 3. Irregular areas of interstitial thickening with ill-defined nodules, largest in the right upper lobe measuring 6 mm. This is concerning for metastatic disease. Left neck base adenopathy also concerning metastatic disease although this could be reactive. 4. Numerous low-density liver lesions consistent with widespread liver metastatic disease. This is likely from the patient's reported duodenal carcinoma. Patient has a stent the spans the second portion the duodenum, as well is a biliary stent. There is presumed metastatic peripancreatic and gastrohepatic ligament adenopathy. 5. There is also small amount ascites and diffuse soft tissue edema consistent with anasarca". Will transfer patient to the stepdown unit, transfuse PRBC, check stool for C. difficile colitis/check free T4 continue IV fluids/antibiotics(including Flagyl)/give pressors as needed for map> 65, give Synthroid, drain Pleurx catheter, consult pulmonary and critical care in a.m. regarding abnormal CT findings, and also consult GI. Patient's condition is critical. Time spent for this critical care follow-up is approximately 45 minutes. Plan Sepsis (HCC)/Leukocytosis/Acute kidney injury (HCC)/Lactic acidosis/Hyperkalemia/HCAP (healthcare-associated pneumonia)-? Empyema  Follow Septic workup-UA/urine culture/blood culture/Pleurx fluid culture/C. difficile PCR  Levophed as needed/IV fluids/Vancomycin/Zosyn/Flagyl  Drain Pleurx catheter as patient does at home and send fluid for culture  Consult PCCM Duodenal cancer (HCC)/Pulmonary embolism (HCC)/Iron deficiency anemia due to chronic blood loss  No anticoagulation as bleeding risk. Fortunately has IVC filter.  Transfuse 2 units PRBC transfusion  PPI  Consult GI in  am Diabetes mellitus, type 2 (HCC)/hypothyroidism  Follow hemoglobin A1c  Lantus/SSI/Synthroid Migraine/essential HTN (hypertension)  Monitor Protein-calorie malnutrition, severe  Glucerna DVT/GI Prophylaxis  SCDs  PPI Family Communication: Spoke with wife [name] at [phone]. She says patient is full code.  Code Status   Full code  Likely DC  ?Back to SNF  Consultants:    Procedures:    Antibiotics:  Vancomycin 04/08/2015  Zosyn 04/05/2015  Flagyl 03/19/2015  HPI/Subjective: Feels "bad".  Objective: Filed Vitals:   03/19/15 1617 03/19/15 1800  BP: 92/58 86/55  Pulse: 97 101  Temp: 97.8 F (36.6 C)   Resp: 18 20    Intake/Output Summary (Last 24 hours) at 03/19/15 1901 Last data filed at 03/19/15 1800  Gross per 24 hour  Intake 2387.58 ml  Output    175 ml  Net 2212.58 ml   Filed Weights   04/07/2015 1759 03/19/15 0450  Weight: 66.6 kg (146 lb 13.2 oz) 67.4 kg (148 lb 9.4 oz)    Exam:   General:  Comfortable at rest. Pale.  Cardiovascular: S1-S2 normal. No murmurs. Pulse regular.  Respiratory: Good air entry bilaterally. No rhonchi or rales.  Abdomen: Soft and nontender. Normal bowel sounds. No organomegaly.  Musculoskeletal: No pedal edema   Neurological: Intact  Data Reviewed: Basic Metabolic Panel:  Recent Labs Lab 04/11/2015 1455 03/19/15 0700 03/19/15 1145  NA 138  --  136  K 5.4*  --  4.7  CL 103  --  105  CO2 18*  --  21*  GLUCOSE 170*  --  125*  BUN 53*  --  58*  CREATININE 2.02*  --  2.16*  CALCIUM 7.8*  --  7.0*  MG  --  1.8  --   PHOS  --  4.4  --    Liver Function Tests:  Recent Labs Lab 04/11/2015 1455 03/19/15 1145  AST 52* 47*  ALT 38 33  ALKPHOS 909* 703*  BILITOT 2.6* 1.9*  PROT 4.6* 3.9*  ALBUMIN 1.6* 1.3*    Recent Labs Lab 03/20/2015 1455  LIPASE 11   No results for input(s): AMMONIA in the last 168 hours. CBC:  Recent Labs Lab 04/04/2015 1455 03/19/15 0700  WBC 24.8* 29.3*   NEUTROABS 22.6* 25.7*  HGB 8.3* 7.9*  HCT 26.5* 25.1*  MCV 93.0 95.1  PLT 151 134*   Cardiac Enzymes:  Recent Labs Lab 03/21/2015 1455  TROPONINI 0.71*   BNP (last 3 results)  Recent Labs  03/05/15 1530 03/17/2015 1456  BNP 113.2* 113.0*    ProBNP (last 3 results) No results for input(s): PROBNP in the last 8760 hours.  CBG:  Recent Labs Lab 03/31/2015 2140 03/19/15 0747 03/19/15 1229 03/19/15 1806  GLUCAP 178* 126* 110* 105*    Recent Results (from the past 240 hour(s))  Culture, blood (routine x 2)     Status: None (Preliminary result)   Collection Time: 04/02/2015  4:00 PM  Result Value Ref Range Status   Specimen Description RIGHT ANTECUBITAL  Final   Special Requests BOTTLES DRAWN AEROBIC AND ANAEROBIC 5CC  Final   Culture   Final    NO GROWTH < 24 HOURS Performed at [facility]    Report Status PENDING  Incomplete  Culture, blood (routine x 2)     Status: None (Preliminary result)   Collection Time: 03/23/2015  4:35 PM  Result Value Ref Range Status   Specimen Description BLOOD  Final   Special Requests BOTTLES DRAWN AEROBIC AND ANAEROBIC 5CC  Final   Culture   Final    NO GROWTH < 24 HOURS Performed at [facility]    Report Status PENDING  Incomplete     Studies: Ct Abdomen Pelvis Wo Contrast  03/25/2015  CLINICAL DATA:  Patient was admitted for sepsis, also has hx of liver ca 08/2014,  GERD, kidney stones, HTN, diabetic, cancer of duodenum 10/2014, patient stated he was not doing treatments at this timePatient is from [place] with a complaint of chronic "lung pain". Patient has stage 4 lung cancer and was medicated with oxycodone 5mg  at the facility and the patient was not satisfied with the result. However he rates his pain as a 3. Patient has multiple complaints of dehydration, back pain, and blurred vision due to dehydration. EXAM: CT CHEST, ABDOMEN AND PELVIS WITHOUT CONTRAST TECHNIQUE: Multidetector CT imaging of the  chest, abdomen and pelvis was performed following the standard protocol without IV contrast. COMPARISON:  None. FINDINGS: CT CHEST Neck base and axilla: Mildly enlarged left neck base lymph nodes, largest measuring 13 mm in short axis. No axillary masses or adenopathy. Mediastinum and hila: Heart normal in size configuration. There are dense coronary artery calcifications. Ascending aorta is prominent measuring 3.8 x 3.6 cm. Multiple sub cm mediastinal lymph nodes. No pathologically enlarged lymph nodes. Lungs and pleura: There is complex fluid containing bubbles of air mid extends from the left apex along the left lateral and posterior hemi thorax the left lung base. A chest tube lies within this collection. This may all be pleural or reflect combination of lung necrosis and empyema air. The underlying left lung is mostly opacified. Aerated portions show irregular interstitial thickening hazy areas of airspace opacity. On the right common there is irregular interstitial thickening with ill-defined small nodules, largest in the left upper lobe, image 23, series 4 measuring 6 mm. Associated with a moderate pleural effusion. CT ABDOMEN AND PELVIS Liver: There are multiple ill-defined low-density masses consistent widespread metastatic disease. Hit in the posterior segment of the right lobe, there is an ill-defined lesion that measures approximately 3.9 x 1.9 cm transversely. In the central liver between the medial segment of the left lobe and anterior segment of the right lobe there is a 2.9 cm lesion. Spleen:  Unremarkable. Gallbladder, biliary tree and pancreas: Gallbladder surgically absent. There is a biliary stent. Another stent is seen within the duodenum prominent and mildly enlarged lymph nodes are noted along the gastrohepatic ligament, largest measuring 12 mm in short axis. There are prominent peripancreatic lymph nodes. No convincing pancreatic mass or inflammation. Adrenal glands:  No adrenal masses.  Kidneys, ureters, bladder: 4 cm midpole right renal low-attenuation mass consistent with a cyst. Left renal cortical thinning. No renal stones. No hydronephrosis. Ureters are normal course and caliber. Bladder is unremarkable. Ascites: Small amount of ascites collects adjacent to the liver on and in the pelvis. Gastrointestinal: Mild distention of the right colon and transverse colon with air and stool. No bowel obstruction. No bowel wall thickening or inflammatory changes. Stent extends throughout the second portion duodenum. Surrounding soft tissues. There is diffuse soft tissue edema that is most prominent along the flanks. MUSCULOSKELETAL No osteoblastic or osteolytic lesions. IMPRESSION: 1. Complex fluid collection in the left hemi thorax consistent with an empyema. There is likely associated necrotic pneumonia. Additional left lung consolidation is likely combination of infection and atelectasis. 2. Moderate right pleural effusion. 3. Irregular areas of interstitial thickening with ill-defined nodules, largest in the right upper lobe measuring 6 mm. This is concerning for metastatic disease. Left neck base adenopathy also concerning metastatic disease although this could be reactive. 4. Numerous low-density liver lesions consistent with widespread liver metastatic disease. This is likely from the patient's reported duodenal carcinoma. Patient has a stent the spans the second portion the duodenum, as well is  a biliary stent. There is presumed metastatic peripancreatic and gastrohepatic ligament adenopathy. 5. There is also small amount ascites and diffuse soft tissue edema consistent with anasarca. Electronically Signed   By: Lajean Manes M.D.   On: 03/30/2015 21:42   Ct Chest Wo Contrast  04/07/2015  CLINICAL DATA:  Patient was admitted for sepsis, also has hx of liver ca 08/2014, GERD, kidney stones, HTN, diabetic, cancer of duodenum 10/2014, patient stated he was not doing treatments at this timePatient is  from [place] with a complaint of chronic "lung pain". Patient has stage 4 lung cancer and was medicated with oxycodone 5mg  at the facility and the patient was not satisfied with the result. However he rates his pain as a 3. Patient has multiple complaints of dehydration, back pain, and blurred vision due to dehydration. EXAM: CT CHEST, ABDOMEN AND PELVIS WITHOUT CONTRAST TECHNIQUE: Multidetector CT imaging of the chest, abdomen and pelvis was performed following the standard protocol without IV contrast. COMPARISON:  None. FINDINGS: CT CHEST Neck base and axilla: Mildly enlarged left neck base lymph nodes, largest measuring 13 mm in short axis. No axillary masses or adenopathy. Mediastinum and hila: Heart normal in size configuration. There are dense coronary artery calcifications. Ascending aorta is prominent measuring 3.8 x 3.6 cm. Multiple sub cm mediastinal lymph nodes. No pathologically enlarged lymph nodes. Lungs and pleura: There is complex fluid containing bubbles of air mid extends from the left apex along the left lateral and posterior hemi thorax the left lung base. A chest tube lies within this collection. This may all be pleural or reflect combination of lung necrosis and empyema air. The underlying left lung is mostly opacified. Aerated portions show irregular interstitial thickening hazy areas of airspace opacity. On the right common there is irregular interstitial thickening with ill-defined small nodules, largest in the left upper lobe, image 23, series 4 measuring 6 mm. Associated with a moderate pleural effusion. CT ABDOMEN AND PELVIS Liver: There are multiple ill-defined low-density masses consistent widespread metastatic disease. Hit in the posterior segment of the right lobe, there is an ill-defined lesion that measures approximately 3.9 x 1.9 cm transversely. In the central liver between the medial segment of the left lobe and anterior segment of the right lobe there is a  2.9 cm lesion. Spleen:  Unremarkable. Gallbladder, biliary tree and pancreas: Gallbladder surgically absent. There is a biliary stent. Another stent is seen within the duodenum prominent and mildly enlarged lymph nodes are noted along the gastrohepatic ligament, largest measuring 12 mm in short axis. There are prominent peripancreatic lymph nodes. No convincing pancreatic mass or inflammation. Adrenal glands:  No adrenal masses. Kidneys, ureters, bladder: 4 cm midpole right renal low-attenuation mass consistent with a cyst. Left renal cortical thinning. No renal stones. No hydronephrosis. Ureters are normal course and caliber. Bladder is unremarkable. Ascites: Small amount of ascites collects adjacent to the liver on and in the pelvis. Gastrointestinal: Mild distention of the right colon and transverse colon with air and stool. No bowel obstruction. No bowel wall thickening or inflammatory changes. Stent extends throughout the second portion duodenum. Surrounding soft tissues. There is diffuse soft tissue edema that is most prominent along the flanks. MUSCULOSKELETAL No osteoblastic or osteolytic lesions. IMPRESSION: 1. Complex fluid collection in the left hemi thorax consistent with an empyema. There is likely associated necrotic pneumonia. Additional left lung consolidation is likely combination of infection and atelectasis. 2. Moderate right pleural effusion. 3. Irregular areas of interstitial thickening  with ill-defined nodules, largest in the right upper lobe measuring 6 mm. This is concerning for metastatic disease. Left neck base adenopathy also concerning metastatic disease although this could be reactive. 4. Numerous low-density liver lesions consistent with widespread liver metastatic disease. This is likely from the patient's reported duodenal carcinoma. Patient has a stent the spans the second portion the duodenum, as well is a biliary stent. There is presumed metastatic peripancreatic and gastrohepatic  ligament adenopathy. 5. There is also small amount ascites and diffuse soft tissue edema consistent with anasarca. Electronically Signed   By: Lajean Manes M.D.   On: 03/20/2015 21:42   Dg Chest Port 1 View  03/19/2015  CLINICAL DATA:  70 year old male with chronic lung pain and stage IV lung cancer EXAM: PORTABLE CHEST 1 VIEW COMPARISON:  Prior chest x-ray 03/12/2015 FINDINGS: Right IJ approach single-lumen power injectable port catheter. Catheter tip overlies the superior cavoatrial junction. The patient is rotated to the left. There is chronic volume loss with right-to-left shift of the cardiac and mediastinal structures. Similar appearance of the left lung with a small portion of aerated left upper lobe. The remainder of the lung is consolidated. Chronic bronchitic change and interstitial prominence noted throughout the aerated right lung. No acute osseous abnormality. IMPRESSION: Stable chest x-ray without evidence of acute cardiopulmonary process. Electronically Signed   By: Jacqulynn Cadet M.D.   On: 03/19/2015 14:40    Scheduled Meds: . feeding supplement (GLUCERNA SHAKE)  237 mL Oral TID BM  . ferrous sulfate  325 mg Oral BID WC  . insulin aspart  0-9 Units Subcutaneous TID WC  . insulin glargine  5 Units Subcutaneous QHS  . levothyroxine  12.5 mcg Intravenous Daily  . metronidazole  500 mg Intravenous Q8H  . pantoprazole (PROTONIX) IV  40 mg Intravenous Q12H  . piperacillin-tazobactam  3.375 g Intravenous Q8H  . sodium chloride  3 mL Intravenous Q12H  . vancomycin  1,000 mg Intravenous Q24H   Continuous Infusions: . sodium chloride 100 mL/hr at 03/19/15 1110  . norepinephrine (LEVOPHED) Adult infusion       Time spent: 25 minutes    Penn Grissett  [facility] Pager [phone]. If 7PM-7AM, please contact night-coverage at [email or web address], password [facility] 03/19/2015, 7:01 PM  LOS: 1 day

## 2015-03-20 ENCOUNTER — Inpatient Hospital Stay (HOSPITAL_COMMUNITY): Payer: Medicare Other

## 2015-03-20 ENCOUNTER — Encounter (HOSPITAL_COMMUNITY): Admission: EM | Disposition: E | Payer: Self-pay | Source: Home / Self Care | Attending: Internal Medicine

## 2015-03-20 ENCOUNTER — Encounter (HOSPITAL_COMMUNITY): Payer: Self-pay | Admitting: Pulmonary Disease

## 2015-03-20 DIAGNOSIS — C170 Malignant neoplasm of duodenum: Secondary | ICD-10-CM

## 2015-03-20 DIAGNOSIS — Z22322 Carrier or suspected carrier of Methicillin resistant Staphylococcus aureus: Secondary | ICD-10-CM

## 2015-03-20 DIAGNOSIS — J869 Pyothorax without fistula: Secondary | ICD-10-CM

## 2015-03-20 DIAGNOSIS — K921 Melena: Secondary | ICD-10-CM | POA: Insufficient documentation

## 2015-03-20 DIAGNOSIS — D500 Iron deficiency anemia secondary to blood loss (chronic): Secondary | ICD-10-CM

## 2015-03-20 HISTORY — PX: ESOPHAGOGASTRODUODENOSCOPY: SHX5428

## 2015-03-20 LAB — CBC WITH DIFFERENTIAL/PLATELET
BASOS ABS: 0 10*3/uL (ref 0.0–0.1)
Basophils Relative: 0 %
EOS PCT: 1 %
Eosinophils Absolute: 0.3 10*3/uL (ref 0.0–0.7)
HCT: 34.1 % — ABNORMAL LOW (ref 39.0–52.0)
Hemoglobin: 11.2 g/dL — ABNORMAL LOW (ref 13.0–17.0)
LYMPHS ABS: 1.3 10*3/uL (ref 0.7–4.0)
Lymphocytes Relative: 5 %
MCH: 29.9 pg (ref 26.0–34.0)
MCHC: 32.8 g/dL (ref 30.0–36.0)
MCV: 90.9 fL (ref 78.0–100.0)
MONO ABS: 0.8 10*3/uL (ref 0.1–1.0)
MONOS PCT: 3 %
NEUTROS PCT: 91 %
Neutro Abs: 23.5 10*3/uL — ABNORMAL HIGH (ref 1.7–7.7)
Platelets: 103 10*3/uL — ABNORMAL LOW (ref 150–400)
RBC: 3.75 MIL/uL — AB (ref 4.22–5.81)
RDW: 19.2 % — AB (ref 11.5–15.5)
WBC: 25.9 10*3/uL — AB (ref 4.0–10.5)

## 2015-03-20 LAB — TYPE AND SCREEN
ABO/RH(D): O POS
Antibody Screen: NEGATIVE
UNIT DIVISION: 0
Unit division: 0

## 2015-03-20 LAB — HEMOGLOBIN A1C
HEMOGLOBIN A1C: 5.9 % — AB (ref 4.8–5.6)
Mean Plasma Glucose: 123 mg/dL

## 2015-03-20 LAB — PROTIME-INR
INR: 1.29 (ref 0.00–1.49)
PROTHROMBIN TIME: 16.2 s — AB (ref 11.6–15.2)

## 2015-03-20 LAB — GLUCOSE, CAPILLARY
GLUCOSE-CAPILLARY: 63 mg/dL — AB (ref 65–99)
GLUCOSE-CAPILLARY: 72 mg/dL (ref 65–99)
Glucose-Capillary: 122 mg/dL — ABNORMAL HIGH (ref 65–99)
Glucose-Capillary: 96 mg/dL (ref 65–99)
Glucose-Capillary: 82 mg/dL (ref 65–99)
Glucose-Capillary: 87 mg/dL (ref 65–99)
Glucose-Capillary: 79 mg/dL (ref 65–99)

## 2015-03-20 LAB — COMPREHENSIVE METABOLIC PANEL
ALBUMIN: 1.6 g/dL — AB (ref 3.5–5.0)
ALT: 38 U/L (ref 17–63)
ANION GAP: 10 (ref 5–15)
AST: 74 U/L — AB (ref 15–41)
Alkaline Phosphatase: 807 U/L — ABNORMAL HIGH (ref 38–126)
BUN: 58 mg/dL — AB (ref 6–20)
CHLORIDE: 106 mmol/L (ref 101–111)
CO2: 22 mmol/L (ref 22–32)
Calcium: 7.3 mg/dL — ABNORMAL LOW (ref 8.9–10.3)
Creatinine, Ser: 2.13 mg/dL — ABNORMAL HIGH (ref 0.61–1.24)
GFR calc Af Amer: 35 mL/min — ABNORMAL LOW (ref >60)
GFR calc non Af Amer: 30 mL/min — ABNORMAL LOW (ref >60)
GLUCOSE: 93 mg/dL (ref 65–99)
POTASSIUM: 4.2 mmol/L (ref 3.5–5.1)
SODIUM: 138 mmol/L (ref 135–145)
TOTAL PROTEIN: 4.5 g/dL — AB (ref 6.5–8.1)
Total Bilirubin: 3.5 mg/dL — ABNORMAL HIGH (ref 0.3–1.2)

## 2015-03-20 LAB — CORTISOL: Cortisol, Plasma: 27.6 ug/dL

## 2015-03-20 LAB — GRAM STAIN

## 2015-03-20 LAB — MRSA PCR SCREENING: MRSA BY PCR: POSITIVE — AB

## 2015-03-20 LAB — MAGNESIUM: Magnesium: 1.9 mg/dL (ref 1.7–2.4)

## 2015-03-20 LAB — PHOSPHORUS: Phosphorus: 3.7 mg/dL (ref 2.5–4.6)

## 2015-03-20 SURGERY — EGD (ESOPHAGOGASTRODUODENOSCOPY)
Anesthesia: Moderate Sedation

## 2015-03-20 MED ORDER — CHLORHEXIDINE GLUCONATE CLOTH 2 % EX PADS
6.0000 | MEDICATED_PAD | Freq: Every day | CUTANEOUS | Status: DC
  Administered 2015-03-20: 6 via TOPICAL

## 2015-03-20 MED ORDER — CETYLPYRIDINIUM CHLORIDE 0.05 % MT LIQD
7.0000 mL | Freq: Two times a day (BID) | OROMUCOSAL | Status: DC
  Administered 2015-03-20 – 2015-03-22 (×4): 7 mL via OROMUCOSAL

## 2015-03-20 MED ORDER — UNJURY CHICKEN SOUP POWDER
8.0000 [oz_av] | Freq: Once | ORAL | Status: AC
  Administered 2015-03-20: 8 [oz_av] via ORAL
  Filled 2015-03-20: qty 27

## 2015-03-20 MED ORDER — UNJURY CHICKEN SOUP POWDER
8.0000 [oz_av] | Freq: Two times a day (BID) | ORAL | Status: DC
  Filled 2015-03-20 (×2): qty 27

## 2015-03-20 MED ORDER — MIDAZOLAM HCL 10 MG/2ML IJ SOLN
INTRAMUSCULAR | Status: DC | PRN
  Administered 2015-03-20: 2 mg via INTRAVENOUS

## 2015-03-20 MED ORDER — MUPIROCIN 2 % EX OINT
1.0000 "application " | TOPICAL_OINTMENT | Freq: Two times a day (BID) | CUTANEOUS | Status: DC
  Administered 2015-03-20 – 2015-03-22 (×5): 1 via NASAL
  Filled 2015-03-20: qty 22

## 2015-03-20 MED ORDER — FAMOTIDINE IN NACL 20-0.9 MG/50ML-% IV SOLN
20.0000 mg | Freq: Every day | INTRAVENOUS | Status: DC
  Administered 2015-03-20 – 2015-03-22 (×3): 20 mg via INTRAVENOUS
  Filled 2015-03-20 (×3): qty 50

## 2015-03-20 MED ORDER — SODIUM CHLORIDE 0.9 % IV SOLN
INTRAVENOUS | Status: DC
  Administered 2015-03-20: 1000 mL via INTRAVENOUS

## 2015-03-20 MED ORDER — UNJURY VANILLA POWDER
8.0000 [oz_av] | Freq: Two times a day (BID) | ORAL | Status: DC
  Filled 2015-03-20 (×6): qty 27

## 2015-03-20 MED ORDER — FENTANYL CITRATE (PF) 100 MCG/2ML IJ SOLN
INTRAMUSCULAR | Status: AC
  Filled 2015-03-20: qty 2

## 2015-03-20 MED ORDER — DIPHENHYDRAMINE HCL 50 MG/ML IJ SOLN
INTRAMUSCULAR | Status: AC
  Filled 2015-03-20: qty 1

## 2015-03-20 MED ORDER — FENTANYL CITRATE (PF) 100 MCG/2ML IJ SOLN
INTRAMUSCULAR | Status: DC | PRN
  Administered 2015-03-20: 25 ug via INTRAVENOUS

## 2015-03-20 MED ORDER — DEXTROSE 50 % IV SOLN
INTRAVENOUS | Status: AC
  Administered 2015-03-20: 25 mL
  Filled 2015-03-20: qty 50

## 2015-03-20 MED ORDER — MIDAZOLAM HCL 5 MG/ML IJ SOLN
INTRAMUSCULAR | Status: AC
  Filled 2015-03-20: qty 2

## 2015-03-20 NOTE — Consult Note (Signed)
Referring Provider: [facility] Primary Care Physician:  Shiela Mayer, [place] Primary Gastroenterologist:  unassigned  Reason for Consultation:  Melena in pt with duodenal cancer    HPI: Manuel Barr is a 70 y.o. male with history of metastatic duodenal cancer. He is status post exploratory laparotomy, wedge resection of the liver, open cholecystectomy and aborted Whipple procedure in June 2016.hhe has been followed by oncology at [facility] and has some of his care at the [facility], [place]. He is status post recent admission December 25 through December 29 of 2016 with a healthcare associated pneumonia.e went to a skilled nursing facility after that admission. He was readmitted to the hospital in 03/28/2015 with complaints of diminished  Oral intake and generalized weakness. 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 Creatinine was 2.2, alkaline phosphatase 99, troponin 0.71, BNP 113, lactic acid 2.83. Chest x-ray showed chronic volume loss with a right to left shift. He was admitted to the hospitalist service and treated with volume resuscitation and antibiotics. Unfortunately despite antibiotics and fluids his pressure remained low and he was noted to have melena on January 8. He then had a CT of the chest and abdomen which showed a fluid collection in the left hemithorax consistent with an empyema, associated necrotic pneumonia and atelectasis, moderate right pleural effusion and nodules concerning for metastatic disease. C. Difficile was positive.review of care everywhere chart  shows that he had a biliary stent placed in July 2 1 stent placed in November at [facility]. He received 2 units of packed red blood cells on January 8.emoglobin this morning was 11.2. Patient denies abdominal pain. States stools have been dark for several days.atient was started on levothyroxine last night. It should also be noted that MRSA PCR is positive.   Past Medical History  Diagnosis Date  . Type II diabetes mellitus ([place])   . Hyperlipemia     Archie Endo 11/07/2014  . Hypertension     Archie Endo 11/07/2014  . Kidney stones   . Cancer of duodenum ([place]) dx'd 08/2014    w/liver mets/notes 11/07/2014  . Liver cancer ([place]) dx'd 08/2014  . Macular degeneration, bilateral   . GERD (gastroesophageal reflux disease)   . Sleep apnea     "suppose to use a mask but I haven't in awhile" (11/07/2014)  . Pulmonary embolism ([place]) 02/2015    DVT/PE s/p IVC filter   . GIB (gastrointestinal bleeding)   . Empyema ([place]) 03/2015    Past Surgical History  Procedure Laterality Date  . Abdominal exploration surgery  08/2014    attempted whipple per wife -but unable to resect /notes 11/07/2014  . Bile duct stent placement  09/2014    Archie Endo 11/07/2014  . Cholecystectomy open  08/2014    Prior to Admission medications   Medication Sig Start Date End Date Taking? Authorizing Provider  cyclobenzaprine (FLEXERIL) 10 MG tablet Take 5 mg by mouth at bedtime as needed for muscle spasms. Muscle spasms   Yes Historical Provider, MD  feeding supplement, GLUCERNA SHAKE, (GLUCERNA SHAKE) LIQD Take 237 mLs by mouth 3 (three) times daily between meals. 03/09/15  Yes Venetia Maxon Rama, MD  ferrous sulfate 325 (65 FE) MG tablet Take 1 tablet (325 mg total) by mouth 2 (two) times daily with a meal. 03/09/15  Yes Christina P Rama, MD  fluticasone (FLONASE) 50 MCG/ACT nasal spray Place 1 spray into both nostrils daily as needed for allergies or rhinitis.    Yes Historical Provider, MD  insulin aspart (NOVOLOG) 100 UNIT/ML injection  Inject 0-15 Units into the skin 3 (three) times daily with meals. 03/09/15  Yes Christina P Rama, MD  insulin aspart (NOVOLOG) 100 UNIT/ML injection Inject 0-5 Units into the skin at bedtime. 03/09/15  Yes Venetia Maxon Rama, MD  insulin glargine (LANTUS) 100 UNIT/ML injection Inject 0.05 mLs (5 Units total) into the skin at bedtime. 03/09/15  Yes Christina P Rama, MD  nystatin (MYCOSTATIN) 100000 UNIT/ML suspension Take 6 mLs by mouth 3 (three) times daily. Swish and swallow 6cc   Yes Historical Provider, MD  ondansetron (ZOFRAN) 8 MG tablet Take 8 mg by mouth every 8 (eight) hours as needed for nausea or vomiting.    Yes Historical Provider, MD  oxycodone (OXY-IR) 5 MG capsule Take 1 capsule (5 mg total) by mouth every 4 (four) hours as needed for pain. 03/09/15  Yes Christina P Rama, MD  pantoprazole (PROTONIX) 40 MG tablet Take 40 mg by mouth daily as needed (heartburn).    Yes Historical Provider, MD    Current Facility-Administered Medications  Medication Dose Route Frequency Provider Last Rate Last Dose  . 0.9 %  sodium chloride infusion   Intravenous Continuous Simbiso Ranga, MD 100 mL/hr at 03/12/2015 0903 1,000 mL at 04/09/2015 0903  . acetaminophen (TYLENOL) tablet 650 mg  650 mg Oral Q6H PRN Simbiso Ranga, MD       Or  . acetaminophen (TYLENOL) suppository 650 mg  650 mg Rectal Q6H PRN Simbiso Ranga, MD      . alum & mag hydroxide-simeth (MAALOX/MYLANTA) 200-200-20 MG/5ML suspension 30 mL  30 mL Oral Q6H PRN Simbiso Ranga, MD      . antiseptic oral rinse (CPC / CETYLPYRIDINIUM CHLORIDE 0.05%) solution 7 mL  7 mL Mouth Rinse BID Simbiso Ranga, MD   7 mL at 03/15/2015 0230  . Chlorhexidine Gluconate Cloth 2 % PADS 6 each  6 each Topical Q0600 Simbiso Ranga, MD      . famotidine (PEPCID) IVPB 20 mg premix  20 mg Intravenous Daily Donita Brooks, NP      . feeding supplement (GLUCERNA SHAKE) (GLUCERNA SHAKE) liquid 237 mL  237 mL Oral TID BM Simbiso Ranga, MD   237 mL at 03/12/2015 0944  . ferrous  sulfate tablet 325 mg  325 mg Oral BID WC Simbiso Ranga, MD   325 mg at 03/13/2015 0831  . fluticasone (FLONASE) 50 MCG/ACT nasal spray 1 spray  1 spray Each Nare Daily PRN Simbiso Ranga, MD      . HYDROmorphone (DILAUDID) injection 0.5 mg  0.5 mg Intravenous Q4H PRN Simbiso Ranga, MD   0.5 mg at 03/16/2015 0054  . insulin aspart (novoLOG) injection 0-9 Units  0-9 Units Subcutaneous TID WC Simbiso Ranga, MD   1 Units at 03/19/15 0859  . levothyroxine (SYNTHROID, LEVOTHROID) injection 12.5 mcg  12.5 mcg Intravenous Daily Simbiso Ranga, MD   12.5 mcg at 03/26/2015 0949  . lip balm (CARMEX) ointment   Topical PRN Simbiso Ranga, MD      . mupirocin ointment (BACTROBAN) 2 % 1 application  1 application Nasal BID Nat Math, MD   1 application at [id number]  [id number]  . norepinephrine (LEVOPHED) 4 mg in dextrose 5 % 250 mL (0.016 mg/mL) infusion  0-40 mcg/min Intravenous Titrated Simbiso Ranga, MD 18.8 mL/hr at 03/27/2015 1250 5 mcg/min at 04/10/2015 1250  . ondansetron (ZOFRAN) injection 4 mg  4 mg Intravenous Q6H PRN Simbiso Ranga, MD      . oxyCODONE (Oxy IR/ROXICODONE) immediate release tablet 5 mg  5 mg Oral Q4H PRN Simbiso Ranga, MD   5 mg at 03/29/2015 1007  . piperacillin-tazobactam (ZOSYN) IVPB 3.375 g  3.375 g Intravenous Q8H Simbiso Ranga, MD   3.375 g at 03/24/2015 0953  . protein supplement (UNJURY VANILLA) powder 8 oz  8 oz Oral BID Clayton Bibles, RD      . sodium chloride 0.9 % injection 10-40 mL  10-40 mL Intracatheter PRN Simbiso Ranga, MD      . sodium chloride 0.9 % injection 3 mL  3 mL Intravenous Q12H Simbiso Ranga, MD   3 mL at 03/27/2015 0955  . vancomycin (VANCOCIN) 50 mg/mL oral solution 125 mg  125 mg Oral QID Jeryl Columbia, NP   125 mg at 03/19/2015 0951  . vancomycin (VANCOCIN) IVPB 1000 mg/200 mL premix  1,000 mg Intravenous Q24H Audrea Muscat S[place]uk[place], RPH   1,000 mg at 03/19/15 1549    Allergies as of 03/16/2015 - Review Complete 04/05/2015  Allergen Reaction Noted  . Codeine Nausea And Vomiting  10/26/2012  . Other  03/05/2015    Family History  Problem Relation Age of Onset  . Diabetes Mother   . Diabetes Father   . Heart disease Father   . Emphysema Father     Social History   Social History  . Marital Status: Married    Spouse Name: N/A  . Number of Children: N/A  . Years of Education: N/A   Occupational History  . Not on file.   Social History Main Topics  . Smoking status: Never Smoker   . Smokeless tobacco: Never Used  . Alcohol Use: No  . Drug Use: No  . Sexual Activity: Not Currently   Other Topics Concern  . Not on file   Social History Narrative    Review of Systems: Per history of present illness, otherwise negative.  Physical Exam: Vital signs in last 24 hours: Temp:  [97.3 F (36.3 C)-98.3 F (36.8 C)] 98.1 F (36.7 C) (01/09 1230) Pulse Rate:  [97-110] 102 (01/09 1230) Resp:  [14-30] 16 (01/09 1230) BP: (74-125)/(47-76) 89/54 mmHg (01/09 1230) SpO2:  [88 %-95 %] 92 % (01/09 1230) Weight:  [159 lb 13.3 oz (72.5 kg)] 159 lb 13.3 oz (72.5 kg) (01/09 0430) Last BM Date: 03/15/2015 General:   Cachectic, pale chronically ill-appearing male. Head:  Normocephalic and atraumatic. Eyes:  Sclera clear, no icterus.   Conjunctiva pale Ears:  Normal auditory acuity. Nose:  No deformity, discharge,  or lesions. Mouth:  No deformity or lesions.   Neck:  Supple; no masses or thyromegaly.o JVD Lungs:  Crackles on right Heart:  Regular rate and rhythm; no murmurs, clicks, rubs,  or gallops. Abdomen:  Soft,nontender, BS active,nonpalp mass or hsm.   Rectal:  Deferred  Msk:  Symmetrical without gross deformities. . Pulses:  Normal pulses noted. Extremities:1-2+ pitting edema lower extremities Neurologic: Alert and  oriented x4;  grossly normal neurologically. Psych: Alert and cooperative. Normal mood and affect.  Intake/Output from previous day: 01/08 0701 - 01/09 0700 In: 4149.3 [I.V.:3100; Blood:699.3; IV Piggyback:350] Out: 675 [Urine:475;  Drains:200] Intake/Output this shift: Total I/O  In: 432.3 [I.V.:382.3; IV Piggyback:50] Out: -   Lab Results:  Recent Labs  04/03/2015 1455 03/19/15 0700 03/30/2015 0430  WBC 24.8* 29.3* 25.9*  HGB 8.3* 7.9* 11.2*  HCT 26.5* 25.1* 34.1*  PLT 151 134* 103*   BMET  Recent Labs  03/17/2015 1455 03/19/15 1145 03/19/2015 0430  NA 138 136 138  K 5.4* 4.7 4.2  CL 103 105 106  CO2 18* 21* 22  GLUCOSE 170* 125* 93  BUN 53* 58* 58*  CREATININE 2.02* 2.16* 2.13*  CALCIUM 7.8* 7.0* 7.3*   LFT  Recent Labs  04/08/2015 0430  PROT 4.5*  ALBUMIN 1.6*  AST 74*  ALT 38  ALKPHOS 807*  BILITOT 3.5*   PT/INR  Recent Labs  03/19/15 0700 04/10/2015 0430  LABPROT 16.0* 16.2*  INR 1.26 1.29     Studies/Results: Ct Abdomen Pelvis Wo Contrast  03/23/2015  CLINICAL DATA:  Patient was admitted for sepsis, also has hx of liver ca 08/2014, GERD, kidney stones, HTN, diabetic, cancer of duodenum 10/2014, patient stated he was not doing treatments at this time[facility] a [facility] "[facility]". Patient has stage 4 lung cancer and was medicated with oxycodone 5mg  at the facility and the patient was not satisfied with the result. However he rates his pain as a 3. Patient has multiple complaints of dehydration, back pain, and blurred vision due to dehydration. EXAM: CT CHEST, ABDOMEN AND PELVIS WITHOUT CONTRAST TECHNIQUE: Multidetector CT imaging of the chest, abdomen and pelvis was performed following the standard protocol without IV contrast. COMPARISON:  None. FINDINGS: CT CHEST Neck base and axilla: Mildly enlarged left neck base lymph nodes, largest measuring 13 mm in short axis. No axillary masses or adenopathy. Mediastinum and hila: Heart normal in size configuration. There are dense coronary artery calcifications. Ascending aorta is prominent measuring 3.8 x 3.6 cm. Multiple sub cm mediastinal lymph nodes. No pathologically enlarged lymph nodes. Lungs  and pleura: There is complex fluid containing bubbles of air mid extends from the left apex along the left lateral and posterior hemi thorax the left lung base. A chest tube lies within this collection. This may all be pleural or reflect combination of lung necrosis and empyema air. The underlying left lung is mostly opacified. Aerated portions show irregular interstitial thickening hazy areas of airspace opacity. On the right common there is irregular interstitial thickening with ill-defined small nodules, largest in the left upper lobe, image 23, series 4 measuring 6 mm. Associated with a moderate pleural effusion. CT ABDOMEN AND PELVIS Liver: There are multiple ill-defined low-density masses consistent widespread metastatic disease. Hit in the posterior segment of the right lobe, there is an ill-defined lesion that measures approximately 3.9 x 1.9 cm transversely. In the central liver between the medial segment of the left lobe and anterior segment of the right lobe there is a 2.9 cm lesion. Spleen:  Unremarkable. Gallbladder, biliary tree and pancreas: Gallbladder surgically absent. There is a biliary stent. Another stent is seen within the duodenum prominent and mildly enlarged lymph nodes are noted along the gastrohepatic ligament, largest measuring 12 mm in short axis. There are prominent peripancreatic lymph nodes. No convincing pancreatic mass or inflammation. Adrenal glands:  No adrenal masses. Kidneys, ureters, bladder: 4 cm midpole right renal low-attenuation mass consistent with a cyst. Left renal cortical thinning. No renal stones. No hydronephrosis. Ureters are normal course and caliber. Bladder is unremarkable. Ascites: Small amount of ascites collects adjacent to  the liver on and in the pelvis. Gastrointestinal: Mild distention of the right colon and transverse colon with air and stool. No bowel obstruction. No bowel wall thickening or inflammatory changes. Stent extends throughout the second  portion duodenum. Surrounding soft tissues. There is diffuse soft tissue edema that is most prominent along the flanks. MUSCULOSKELETAL No osteoblastic or osteolytic lesions. IMPRESSION: 1. Complex fluid collection in the left hemi thorax consistent with an empyema. There is likely associated necrotic pneumonia. Additional left lung consolidation is likely combination of infection and atelectasis. 2. Moderate right pleural effusion. 3. Irregular areas of interstitial thickening with ill-defined nodules, largest in the right upper lobe measuring 6 mm. This is concerning for metastatic disease. Left neck base adenopathy also concerning metastatic disease although this could be reactive. 4. Numerous low-density liver lesions consistent with widespread liver metastatic disease. This is likely from the patient's reported duodenal carcinoma. Patient has a stent the spans the second portion the duodenum, as well is a biliary stent. There is presumed metastatic peripancreatic and gastrohepatic ligament adenopathy. 5. There is also small amount ascites and diffuse soft tissue edema consistent with anasarca. Electronically Signed   By: Lajean Manes M.D.   On: 03/24/2015 21:42   Ct Chest Wo Contrast  03/20/2015  CLINICAL DATA:  Patient was admitted for sepsis, also has hx of liver ca 08/2014, GERD, kidney stones, HTN, diabetic, cancer of duodenum 10/2014, patient stated he was not doing treatments at this timePatient [place] a [place] of [place] "[place]". Patient has stage 4 lung cancer and was medicated with oxycodone 5mg  at the facility and the patient was not satisfied with the result. However he rates his pain as a 3. Patient has multiple complaints of dehydration, back pain, and blurred vision due to dehydration. EXAM: CT CHEST, ABDOMEN AND PELVIS WITHOUT CONTRAST TECHNIQUE: Multidetector CT imaging of the chest, abdomen and pelvis was performed following the standard protocol  without IV contrast. COMPARISON:  None. FINDINGS: CT CHEST Neck base and axilla: Mildly enlarged left neck base lymph nodes, largest measuring 13 mm in short axis. No axillary masses or adenopathy. Mediastinum and hila: Heart normal in size configuration. There are dense coronary artery calcifications. Ascending aorta is prominent measuring 3.8 x 3.6 cm. Multiple sub cm mediastinal lymph nodes. No pathologically enlarged lymph nodes. Lungs and pleura: There is complex fluid containing bubbles of air mid extends from the left apex along the left lateral and posterior hemi thorax the left lung base. A chest tube lies within this collection. This may all be pleural or reflect combination of lung necrosis and empyema air. The underlying left lung is mostly opacified. Aerated portions show irregular interstitial thickening hazy areas of airspace opacity. On the right common there is irregular interstitial thickening with ill-defined small nodules, largest in the left upper lobe, image 23, series 4 measuring 6 mm. Associated with a moderate pleural effusion. CT ABDOMEN AND PELVIS Liver: There are multiple ill-defined low-density masses consistent widespread metastatic disease. Hit in the posterior segment of the right lobe, there is an ill-defined lesion that measures approximately 3.9 x 1.9 cm transversely. In the central liver between the medial segment of the left lobe and anterior segment of the right lobe there is a 2.9 cm lesion. Spleen:  Unremarkable. Gallbladder, biliary tree and pancreas: Gallbladder surgically absent. There is a biliary stent. Another stent is seen within the duodenum prominent and mildly enlarged lymph nodes are noted along the gastrohepatic ligament,  largest measuring 12 mm in short axis. There are prominent peripancreatic lymph nodes. No convincing pancreatic mass or inflammation. Adrenal glands:  No adrenal masses. Kidneys, ureters, bladder: 4 cm midpole right renal low-attenuation mass  consistent with a cyst. Left renal cortical thinning. No renal stones. No hydronephrosis. Ureters are normal course and caliber. Bladder is unremarkable. Ascites: Small amount of ascites collects adjacent to the liver on and in the pelvis. Gastrointestinal: Mild distention of the right colon and transverse colon with air and stool. No bowel obstruction. No bowel wall thickening or inflammatory changes. Stent extends throughout the second portion duodenum. Surrounding soft tissues. There is diffuse soft tissue edema that is most prominent along the flanks. MUSCULOSKELETAL No osteoblastic or osteolytic lesions. IMPRESSION: 1. Complex fluid collection in the left hemi thorax consistent with an empyema. There is likely associated necrotic pneumonia. Additional left lung consolidation is likely combination of infection and atelectasis. 2. Moderate right pleural effusion. 3. Irregular areas of interstitial thickening with ill-defined nodules, largest in the right upper lobe measuring 6 mm. This is concerning for metastatic disease. Left neck base adenopathy also concerning metastatic disease although this could be reactive. 4. Numerous low-density liver lesions consistent with widespread liver metastatic disease. This is likely from the patient's reported duodenal carcinoma. Patient has a stent the spans the second portion the duodenum, as well is a biliary stent. There is presumed metastatic peripancreatic and gastrohepatic ligament adenopathy. 5. There is also small amount ascites and diffuse soft tissue edema consistent with anasarca. Electronically Signed   By: Lajean Manes M.D.   On: 03/21/2015 21:42   Dg Chest Port 1 View  03/16/2015  CLINICAL DATA:  History of empyema. EXAM: PORTABLE CHEST 1 VIEW COMPARISON:  CT 03/26/2015. FINDINGS: PowerPort catheter noted with lead tip in the right atrium. Left chest tube in stable position. Left lung diffuse infiltrate and left pleural fluid collection again noted. Diffuse  interstitial prominence right lung. This may be related pneumonitis however component congestive heart failure cannot be excluded. Right base atelectasis. No pneumothorax . No acute bony abnormality IMPRESSION: 1. Power port catheter left chest tube in stable position. 2. Persistent diffuse left lung infiltrate and/or pleural fluid collection/empyema. 3. Increased interstitial markings noted throughout the right lung. These changes may be related to pneumonitis. Congestive heart failure cannot be excluded . Right base subsegmental atelectasis . Electronically Signed   By: Marcello Moores  Register   On: 03/16/2015 11:17   Dg Chest Port 1 View  03/31/2015  CLINICAL DATA:  70 year old male with chronic lung pain and stage IV lung cancer EXAM: PORTABLE CHEST 1 VIEW COMPARISON:  Prior chest x-ray 03/12/2015 FINDINGS: Right IJ approach single-lumen power injectable port catheter. Catheter tip overlies the superior cavoatrial junction. The patient is rotated to the left. There is chronic volume loss with right-to-left shift of the cardiac and mediastinal structures. Similar appearance of the left lung with a small portion of aerated left upper lobe. The remainder of the lung is consolidated. Chronic bronchitic change and interstitial prominence noted throughout the aerated right lung. No acute osseous abnormality. IMPRESSION: Stable chest x-ray without evidence of acute cardiopulmonary process. Electronically Signed   By: Jacqulynn Cadet M.D.   On: 03/31/2015 14:40    IMPRESSION/PLAN: 70 year old male with a history of metastatic cancer with metastases to lung and liver, admitted with weakness and poor oral intake. On admission patient is felt to be septic with hypotension,  Elevated lactic acid, and AKI. Further workup positive for C. Difficile  And necrotizing pneumonia/left empyema.  Patient has malignant pleural effusion status post Pleurx catheter placement in November 2016.  Patient has known metastaticduodenal   cancer with chronic GI bleed and melena.He is status post biliary stent placement in July and duodenal stent placement in November.  We've been asked to evaluate for possible EGD to assess upper GI source of bleeding and determine if there is anything further that can be done for this bleeding. The risks, benefits, and alternatives to endoscopy with possible biopsy were discussed with the patient and they consent to proceed. We'll review with the attending as to timing of procedure, may be able to do at bedside today. Palliative care evaluation may be  beneficial for this patient.  Sepsis Diabetes mellitus Duodenal cancer Pulmonary embolism Iron deficiency anemia due to chronic blood loss Hypertension Migraines Protein calorie malnutrition History DVT     Janine Reller, Vita Barley PA-C 03/12/2015,  Pager [phone]  Mon-Fri 8a-5p [phone] after 5p, weekends, holidays

## 2015-03-20 NOTE — Progress Notes (Addendum)
Initial Nutrition Assessment  DOCUMENTATION CODES:   Severe malnutrition in context of chronic illness  INTERVENTION:   -Continue Glucerna Shake po TID, each supplement provides 220 kcal and 10 grams of protein -Provide Unjury Chicken Soup supplement, each packet provides 100 kcal and 21g of protein **Addendum: Unjury Chicken Soup on backorder per Pharmacy. Will order other flavors until the soup supplement is available. -RD to continue to monitor   NUTRITION DIAGNOSIS:   Increased nutrient needs related to cancer and cancer related treatments as evidenced by estimated needs.  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   Pt in room asleep with wife at bedside. Per wife, pt has not eaten hardly at all this whole week. She states he has been living off Glucerna shakes (around 2 a day), milk and water. The pt has recently gained 10 lb but still has significant weight loss since August 2016 (15% wt loss x 4 months). UBW is 232 lb which he last weighed May 2016. Offered for pt to try Unjury chicken soup supplement, wife agreed to it. RD to order.  Nutrition-Focused physical exam completed. Findings are severe fat depletion, severe muscle depletion, and severe edema.   Labs reviewed: CBGs: 63-107 Elevated BUN & Creatinine Mg/Phos WNL  Diet Order:  Diet Carb Modified Fluid consistency:: Thin; Room service appropriate?:  Yes; Fluid restriction:: Other (see comments)  Skin:  Reviewed, no issues  Last BM:  1/9  Height:   Ht Readings from Last 1 Encounters:  04/03/2015 5\' 2"  (1.575 m)    Weight:   Wt Readings from Last 1 Encounters:  04/08/2015 159 lb 13.3 oz (72.5 kg)    Ideal Body Weight:  53.6 kg  BMI:  Body mass index is 29.23 kg/(m^2).  Estimated Nutritional Needs:   Kcal:  2150-2350  Protein:  100-110g  Fluid:  2.2L/day  EDUCATION NEEDS:   No education needs identified at this time  Clayton Bibles, MS, RD, LDN Pager: [phone] After Hours Pager: [phone]

## 2015-03-20 NOTE — Op Note (Signed)
[place], [id number]   ENDOSCOPY PROCEDURE REPORT  PATIENT: [name], [name]  MR#: [id number] BIRTHDATE: 04/30/1945 , 66  yrs. old GENDER: male ENDOSCOPIST: Jerene Bears, MD REFERRED BY:  [facility] PROCEDURE DATE:  04/04/2015 PROCEDURE:  EGD, diagnostic ASA CLASS:     Class IV INDICATIONS:  melena, anemia requiring transfusion, history of metastatic duodenal cancer status post duodenal stent placement in November at [facility]. MEDICATIONS: Fentanyl 25 mcg IV and Versed 2 mg IV TOPICAL ANESTHETIC: none  DESCRIPTION OF PROCEDURE: After the risks benefits and alternatives of the procedure were thoroughly explained, informed consent was obtained.  The endoscope endoscope was introduced through the mouth and advanced to the second portion of the duodenum , Without limitations.  The instrument was slowly withdrawn as the mucosa was fully examined.      ESOPHAGUS: Moderately severe reflux esophagitis was found in the distal esophagus.  STOMACH: There was a moderate amount of residual food seen in the gastric body.  Due to the residual food, complete mucosal examination could not be performed.   No evidence of outlet obstruction or bleeding from the stomach.  DUODENUM: Ulcerated mucosa in the distal duodenal bulb just proximal to the previous placed duodenal stent.  The duodenal stent is in place and patent.  There is oozing from the intricacies of the stent in the area of duodenal tumor infiltration.  The duodenal ulceration proximal to the stent is clean-based but also very friable.  Retroflexed views revealed a hiatal hernia.     The scope was then withdrawn from the patient and the procedure completed.  COMPLICATIONS: There were no immediate complications.  ENDOSCOPIC IMPRESSION: 1.   Reflux esophagitis in the distal esophagus 2.   Food residue in the gastric body with otherwise normal gastric mucosa.  No evidence of  bleeding from the stomach.  No evidence of outlet obstruction 3.   Ulcerated duodenal mucosa/tumor with previous he placed duodenal stent  RECOMMENDATIONS: 1.  No role for endoscopic  intervention as GI bleeding is coming from known duodenal tumor 2.  Acid suppressing therapy and support blood counts with transfusion as needed 3.  Palliative care consult placed which is very appropriate given advanced metastatic duodenal cancer  eSigned:  Jerene Bears, MD 04/10/2015 5:43 PM    PATIENT NAME:  Manuel Barr, Manuel Barr MR#: [id number]

## 2015-03-20 NOTE — Consult Note (Signed)
PULMONARY / CRITICAL CARE MEDICINE   Name: Manuel Barr MRN: [id number] DOB: 30-Nov-1945    ADMISSION DATE:  03/24/2015 CONSULTATION DATE:  03/19/14  REFERRING MD:  Dr. 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     Initial work up concerning for sepsis (unclear source), lactic acidosis, AKI, and hyperkalemia.  Labs - WBC 24.8, Hgb 8.3, platelets 151, Na 138, K 5.4, Sr Cr 2.02 (up from 0.96), Alk Phos 909, albumin 1.6, AST 52, ALT 38, troponin 0.71, BNP 113, and lactic acid 2.83.  CXR demonstrated chronic volume loss with R to L shift, R IJ port.  He was admitted to telemetry for further work up by [facility].  Pan cultures were obtained. He was treated with volume resuscitation & empiric antibiotics.  Despite fluids and antibiotics, the patients BP remained low.  He was also noted to have large volume loose stool / melena on 1/8 and was transferred to ICU.  To further evaluate sepsis, a CT of the chest / abd were obtained which demonstrated a complex fluid collection in the left hemithorax consistent with an empyema, associated necrotic PNA and atelectasis, moderate R pleural effusion & nodules concerning for metastatic disease.  C-Diff assessed in setting of diarrhea and positive.  Given abnormal CT of the chest, PCCM consulted for evaluation.   PAST MEDICAL HISTORY :  He  has a past medical history of Type II diabetes mellitus ([place]); Hyperlipemia; Hypertension; Kidney stones; Cancer of duodenum ([place]) (dx'd 08/2014); Liver cancer ([place]) (dx'd 08/2014); Macular degeneration, bilateral;  GERD (gastroesophageal reflux disease); and Sleep apnea.  PAST SURGICAL HISTORY: He  has past surgical history that includes Abdominal exploration surgery (08/2014); Bile duct stent placement (09/2014); and Cholecystectomy open (08/2014).  Allergies  Allergen Reactions  . Codeine Nausea And Vomiting  . Other     Pt is on a Mechanical Soft Diet, has a stint in stomach.    No current facility-administered medications on file prior to encounter.   Current Outpatient Prescriptions on File Prior to Encounter  Medication Sig  . cyclobenzaprine (FLEXERIL) 10 MG tablet Take 5 mg by mouth at bedtime as needed for muscle spasms. Muscle spasms  . feeding supplement, GLUCERNA SHAKE, (GLUCERNA SHAKE) LIQD Take 237 mLs by mouth 3 (three) times daily between meals.  . ferrous sulfate 325 (65 FE) MG tablet Take 1 tablet (325 mg total) by mouth 2 (two) times daily with a meal.  . fluticasone (FLONASE) 50 MCG/ACT nasal spray Place 1 spray into both nostrils daily as needed for allergies or rhinitis.   Marland Kitchen insulin aspart (NOVOLOG) 100 UNIT/ML injection Inject 0-15 Units into the skin 3 (three) times daily with meals.  . insulin aspart (NOVOLOG) 100 UNIT/ML injection Inject 0-5 Units into the skin at bedtime.  . insulin glargine (LANTUS) 100 UNIT/ML injection Inject 0.05 mLs (5 Units total) into the skin at bedtime.  . ondansetron (ZOFRAN) 8 MG tablet Take 8 mg by mouth every 8 (eight) hours as needed for nausea or vomiting.   Marland Kitchen oxycodone (OXY-IR) 5 MG capsule Take 1 capsule (5 mg total) by mouth every  4 (four) hours as needed for pain.  . pantoprazole (PROTONIX) 40 MG tablet Take 40 mg by mouth daily as needed (heartburn).     FAMILY HISTORY:  His indicated that his mother is deceased. He indicated that his father is deceased. He indicated that his brother is alive.   SOCIAL HISTORY: He  reports that he has never smoked. He has never used smokeless tobacco. He reports that he does not drink alcohol or use  illicit drugs.  REVIEW OF SYSTEMS:   Gen: Denies fever, chills, weight change, night sweats.  Reports fatigue, discomfort in bed HEENT: Denies blurred vision, double vision, hearing loss, tinnitus, sinus congestion, rhinorrhea, sore throat, neck stiffness, dysphagia PULM: Denies hemoptysis, wheezing. Reports shortness of breath at rest, cough with yellow sputum production CV: Denies chest pain, edema, orthopnea, paroxysmal nocturnal dyspnea, palpitations GI: Denies abdominal pain, nausea, vomiting, hematochezia, constipation, change in bowel habits.  Reports diarrhea, melena  GU: Denies dysuria, hematuria, polyuria, oliguria, urethral discharge Endocrine: Denies hot or cold intolerance, polyuria, polyphagia or appetite change Derm: Denies rash, dry skin, scaling or peeling skin change Heme: Denies easy bruising, bleeding, bleeding gums Neuro: Denies headache, numbness, weakness, slurred speech, loss of memory or consciousness   SUBJECTIVE:  Pt reports generalized discomfort in bed, bilateral heel pain, shortness of breath and cough with yellow sputum production  VITAL SIGNS: BP 107/65 mmHg  Pulse 108  Temp(Src) 98.3 F (36.8 C) (Oral)  Resp 21  Ht '5\' 2"'$  (1.575 m)  Wt 159 lb 13.3 oz (72.5 kg)  BMI 29.23 kg/m2  SpO2 92%  HEMODYNAMICS:    VENTILATOR SETTINGS:    INTAKE / OUTPUT: I/O last 3 completed shifts: In: 4329.3 [I.V.:3230; Blood:699.3; IV Piggyback:400] Out: 675 [Urine:475; Drains:200]  PHYSICAL EXAMINATION: General:  Chronically ill appearing male, cachectic Neuro:  Awake, alert, MAE, speech clear, generalized weakness  HEENT:  MM pink/dry, no jvd, fair dentition  Cardiovascular:  s1s2 rrr, tachycardic, ST on monitor Lungs:  Tachypnea, minimal air movement on L, crackles on R Abdomen:  NTND, bsx4 active Musculoskeletal:  No acute deformities  Skin: warm/dry, BLE 1+ pitting edema, heels boggy/pink  LABS:  BMET  Recent Labs Lab 04/04/2015 1455 03/19/15 1145  03/30/2015 0430  NA 138 136 138  K 5.4* 4.7 4.2  CL 103 105 106  CO2 18* 21* 22  BUN 53* 58* 58*  CREATININE 2.02* 2.16* 2.13*  GLUCOSE 170* 125* 93    Electrolytes  Recent Labs Lab 03/13/2015 1455 03/19/15 0700 03/19/15 1145 04/01/2015 0430  CALCIUM 7.8*  --  7.0* 7.3*  MG  --  1.8  --  1.9  PHOS  --  4.4  --  3.7    CBC  Recent Labs Lab 04/02/2015 1455 03/19/15 0700 03/19/2015 0430  WBC 24.8* 29.3* 25.9*  HGB 8.3* 7.9* 11.2*  HCT 26.5* 25.1* 34.1*  PLT 151 134* 103*    Coag's  Recent Labs Lab 04/09/2015 1455 03/19/15 0700 04/07/2015 0430  APTT  --  31  --   INR 1.24 1.26 1.29    Sepsis Markers  Recent Labs Lab 04/02/2015 1639  LATICACIDVEN 2.83*    ABG No results for input(s): PHART, PCO2ART, PO2ART in the last 168 hours.  Liver Enzymes  Recent Labs Lab 03/27/2015 1455 03/19/15 1145 03/17/2015 0430  AST 52* 47* 74*  ALT 38 33 38  ALKPHOS 909* 703* 807*  BILITOT 2.6* 1.9* 3.5*  ALBUMIN 1.6* 1.3* 1.6*    Cardiac Enzymes  Recent Labs Lab 03/12/2015  1455  TROPONINI 0.71*    Glucose  Recent Labs Lab 03/27/2015 2140 03/19/15 0747 03/19/15 1229 03/19/15 1638 03/19/15 1806 03/19/15 2116  GLUCAP 178* 126* 110* 122* 105* 107*    Imaging No results found.   STUDIES:  CT ABD/Pelvis 1/7  >> numerous low-density liver lesions c/w widespread liver metastatic disease, stent that spans the second portion of the duodenum as well as biliary stent, presumed metastatic peripancreatic and gastrohepatic ligament adenopathy CT Chest >> complex fluid collection in L hemithorax c/w empyema, associated necrotic pneumonia, left lung consolidation / atelectasis, moderate R pleural effusion, irregular areas of interstitial thickening with ill defined nodules concerning for metastatic disease, L neck base adenopathy also concerning for metastatic disease  CULTURES: BCx2 1/7 >>  C-Diff 1/8 >> POSITIVE  Pleural Fluid 1/8 >>   ANTIBIOTICS: Vanco IV 1/7 >>  Zosyn  1/7 >> Vanco PO 1/8 >>   SIGNIFICANT EVENTS: 01/07  Admit with weakness, poor PO intake.  Concern for sepsis of unclear etiology  01/08  Diarrhea, melena.  Tx to ICU.  C-Diff antigen positive  LINES/TUBES: R Chest Port >>  L Pleurx Cath 01/2015 >>   DISCUSSION: 70 y/o M with PMH of metastatic duodenal cancer with mets to lung, pleural space, liver who presented with weakness and poor PO intake.  Initial evaluation concerning for sepsis - AKI, elevated lactic acid, hypotension.  Further work up positive for C-Diff, melena, and concern for necrotizing PNA on L / empyema.    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, Pleural Space Malignant Pleural Effusion s/p Pleurx Catheter - 01/2015 Empyema  OSA P:   Oxygen as needed to support saturations > 92% Drain Pleurx QOD & PRN dyspnea Intermittent CXR  ABX as below Follow pleural cultures  CARDIOVASCULAR A:  Septic Shock - in setting of C-Diff, empyema Hx HLD, HTN P:  ICU monitoring of hemodynamics Wean levophed to off for MAP > 65 Gentle volume, NS @ 100 ml/hr  Assess cortisol   RENAL A:   AKI - in setting of volume depletion with diarrhea (C-Diff) Hx Renal Calculi P:   Trend BMP / UOP  Replace electrolytes as indicated   GASTROINTESTINAL A:   Metastatic Duodenal Cancer with chronic GIB Melena  Protein Calorie Malnutrition  GERD P:   Nutritional supplementation See Heme  D/C protonix given C-diff  Pepcid BID  Carb modified diet as tolerated Monitor stool volume Hold bowel regimen with diarrhea   HEMATOLOGIC A:   Anemia - in setting of chronic GIB with metastatic duodenal ulcer Hx DVT / PE s/p IVC Filter P:  Trend CBC Transfuse for Hgb <% or active bleeding  No anticoagulation due to GIB  INFECTIOUS A:   C-Diff Colitis Empyema / Necrotizing PNA  P:   ABX as above  Follow cultures  Trend lactic acid   ENDOCRINE A:   DM II    P:   SSI   NEUROLOGIC A:   Pain -  secondary to malignancy  P:   RASS goal: n/a PRN dilaudid, oxycodone for pain    FAMILY  - Updates: Patient updated at bedside 1/9.  Began to discuss concepts of palliative care.  Hopeful to be able to fix reversible processes but concerned given his overall state of health (metastatic disease) that aggressive measures at EOL would not be in his best interest.    - Inter-disciplinary family meet or Palliative Care meeting due by:  [place] 1/9.  Noe Gens, NP-C [facility] Pgr: [phone] or if no answer [phone] 04/02/2015, 8:20 AM

## 2015-03-20 NOTE — Progress Notes (Addendum)
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 Source of sepsis is not clear at this point, but possibly healthcare associated pneumonia-UA/urine culture pending, chest x-ray shows "Slight improved aeration of the left lung without other significant change. Continued left lower lung airspace disease/ consolidation and left pleural effusion", ? Cholangitis. Patient will be admitted to telemetry for further management including septic workup-CT chest abdomen and pelvis without contrast, blood culture, UA/urine culture. Meanwhile, he will receive IV fluids(expect acute kidney injury/lactic acidosis/hyperkalemia to improve with hydration), empiric antibiotics and anti-emetics as necessary. Patient unfortunately in severe discomfort and not able to provide much history to go with. We will need to evaluate the history as he gets better, including addressing goals of care etc. He is full code for now. Subsequent developments after admission 03/19/15: Blood pressure remained low despite IV fluids/antibiotics. Patient noted to have loose bowel movements with melena. Hemoglobin dropped to 7.9g/dl. Bun/cr is 53/2.02. White count has increased to 29,300. TSH elevated at 7.05. CT chest abdomen pelvis shows"1. Complex fluid collection  in the left hemi thorax consistent with an empyema. There is likely associated necrotic pneumonia. Additional left lung consolidation is likely combination of infection and atelectasis. 2. Moderate right pleural effusion. 3. Irregular areas of interstitial thickening with ill-defined nodules, largest in the right upper lobe measuring 6 mm. This is concerning for metastatic disease. Left neck base adenopathy also concerning metastatic disease although this could be reactive. 4. Numerous low-density liver lesions consistent with widespread liver metastatic disease. This is likely from the patient's reported duodenal carcinoma. Patient has a stent the spans the second portion the duodenum, as well is a biliary stent. There is presumed metastatic peripancreatic and gastrohepatic ligament adenopathy. 5. There is also small amount ascites and diffuse soft tissue edema consistent with anasarca". Will transfer patient to the stepdown unit, transfuse PRBC, check stool for C. difficile colitis/check free T4 continue IV fluids/antibiotics(including Flagyl)/give pressors as needed for map> 65, give Synthroid, drain Pleurx catheter, consult pulmonary and critical care in a.m. regarding abnormal CT findings, and also consult GI. Patient's condition is critical. Time spent for this critical care follow-up is approximately 45 minutes. 03/15/2015: needed Levophed last night. Hemoglobin has improved to 11g/dl. Still has diarrhea. Renal function slightly improved. MRSA PCR positive. Await blood culture/Pleurx fluid culture/C. Difficile PCR. Patient says he feels slightly better. Discussed care plan with wife  At bedside. Consulted pulmonary and critical care medicine. Appreciate help. We'll continue current management including antibiotics/PPI/pressors, and follow critical care medicine recommendations. Will consult GI regarding  Acute  GI Bleeding. Patient sees oncology/GI/pulmonary at the [facility] in [place]. Plan Sepsis  (HCC)/Leukocytosis/Acute kidney injury (HCC)/Lactic acidosis/Hyperkalemia/HCAP (healthcare-associated pneumonia)-? Empyema  Follow Septic workup-UA/urine culture/blood culture/Pleurx fluid culture/C. difficile PCR  Levophed as needed/IV fluids/Vancomycin/Zosyn/Flagyl  Drain Pleurx catheter as patient does at [employer] other day  Follow PCCMrecommendations Duodenal cancer (HCC)/Pulmonary embolism (HCC)/Iron deficiency anemia due to chronic blood loss  No anticoagulation  as bleeding risk. Fortunately has IVC filter.  Responded to PRBC transfusion  PPI  Consult GI  Diabetes mellitus, type 2 (HCC)/hypothyroidism  Follow hemoglobin A1c  Sugars low last night- ?poor oral intake, d/cLantus  Continue SSI/Synthroid Migraine/essential HTN (hypertension)  Monitor Protein-calorie malnutrition, severe  Glucerna DVT/GI Prophylaxis  SCDs  PPI Family Communication: Spoke with wife Manuel Barr at bedside.  Code Status   Full code  Likely DC  ?Back to SNF  Consultants:  Pulmonary  GI  Procedures:    Antibiotics:  Vancomycin 04/02/2015>  Zosyn 03/17/2015>  Flagyl 03/19/2015>  HPI/Subjective: Feels a little bit better.  Objective: Filed Vitals:   04/08/2015 0800 03/30/2015 0813  BP: 107/65   Pulse: 108   Temp:  98.3 F (36.8 C)  Resp: 21     Intake/Output Summary (Last 24 hours) at 03/26/2015 0829 Last data filed at 03/29/2015 0800  Gross per 24 hour  Intake 4503.75 ml  Output    675 ml  Net 3828.75 ml   Filed Weights   03/25/2015 1759 03/19/15 0450 03/14/2015 0430  Weight: 66.6 kg (146 lb 13.2 oz) 67.4 kg (148 lb 9.4 oz) 72.5 kg (159 lb 13.3 oz)    Exam:   General:  Comfortable at rest.  Cardiovascular: S1-S2 normal. No murmurs. Pulse regular.  Respiratory: Good air entry bilaterally. No rhonchi or rales.  Abdomen: Soft and nontender. Normal bowel sounds. No organomegaly.  Musculoskeletal: No pedal edema   Neurological: Intact  Data  Reviewed: Basic Metabolic Panel:  Recent Labs Lab 03/24/2015 1455 03/19/15 0700 03/19/15 1145 03/28/2015 0430  NA 138  --  136 138  K 5.4*  --  4.7 4.2  CL 103  --  105 106  CO2 18*  --  21* 22  GLUCOSE 170*  --  125* 93  BUN 53*  --  58* 58*  CREATININE 2.02*  --  2.16* 2.13*  CALCIUM 7.8*  --  7.0* 7.3*  MG  --  1.8  --  1.9  PHOS  --  4.4  --  3.7   Liver Function Tests:  Recent Labs Lab 03/31/2015 1455 03/19/15 1145 04/08/2015 0430  AST 52* 47* 74*  ALT 38 33 38  ALKPHOS 909* 703* 807*  BILITOT 2.6* 1.9* 3.5*  PROT 4.6* 3.9* 4.5*  ALBUMIN 1.6* 1.3* 1.6*    Recent Labs Lab 04/08/2015 1455  LIPASE 11   No results for input(s): AMMONIA in the last 168 hours. CBC:  Recent Labs Lab 03/17/2015 1455 03/19/15 0700 04/09/2015 0430  WBC 24.8* 29.3* 25.9*  NEUTROABS 22.6* 25.7* 23.5*  HGB 8.3* 7.9* 11.2*  HCT 26.5* 25.1* 34.1*  MCV 93.0 95.1 90.9  PLT 151 134* 103*   Cardiac Enzymes:  Recent Labs Lab 04/07/2015 1455  TROPONINI 0.71*   BNP (last 3 results)  Recent Labs  03/05/15 1530 04/03/2015 1456  BNP 113.2* 113.0*    ProBNP (last 3 results) No results for input(s): PROBNP in the last 8760 hours.  CBG:  Recent Labs Lab 03/19/15 0747 03/19/15 1229 03/19/15 1638 03/19/15 1806 03/19/15 2116  GLUCAP 126* 110* 122* 105* 107*    Recent Results (from the past 240 hour(s))  Culture, blood (routine x 2)     Status: None (Preliminary result)   Collection Time: 03/19/2015  4:00 PM  Result Value Ref Range Status   Specimen Description RIGHT ANTECUBITAL  Final   Special Requests BOTTLES DRAWN AEROBIC AND ANAEROBIC 5CC  Final   Culture   Final  NO GROWTH < 24 HOURS Performed at [facility]    Report Status PENDING  Incomplete  Culture, blood (routine x 2)     Status: None (Preliminary result)   Collection Time: 03/29/2015  4:35 PM  Result Value Ref Range Status   Specimen Description BLOOD  Final   Special Requests BOTTLES DRAWN AEROBIC AND  ANAEROBIC 5CC  Final   Culture   Final    NO GROWTH < 24 HOURS Performed at [facility]    Report Status PENDING  Incomplete  C difficile quick scan w PCR reflex     Status: Abnormal   Collection Time: 03/19/15  5:29 PM  Result Value Ref Range Status   C Diff antigen POSITIVE (A) NEGATIVE Final   C Diff toxin POSITIVE (A) NEGATIVE Final   C Diff interpretation Positive for toxigenic C. difficile  Final    Comment: CRITICAL RESULT CALLED TO, READ BACK BY AND VERIFIED WITH: Kathyrn Lass RN 2030 03/19/15 A NAVARRO   MRSA PCR Screening     Status: Abnormal   Collection Time: 03/19/15  5:31 PM  Result Value Ref Range Status   MRSA by PCR POSITIVE (A) NEGATIVE Final    Comment:        The GeneXpert MRSA Assay (FDA approved for NASAL specimens only), is one component of a comprehensive MRSA colonization surveillance program. It is not intended to diagnose MRSA infection nor to guide or monitor treatment for MRSA infections. RESULT CALLED TO, READ BACK BY AND VERIFIED WITH: Bethann Humble RN @ [phone] ON 03/20/15 BY C DAVIS      Studies: Ct Abdomen Pelvis Wo Contrast  04/01/2015  CLINICAL DATA:  Patient was admitted for sepsis, also has hx of liver ca 08/2014, GERD, kidney stones, HTN, diabetic, cancer of duodenum 10/2014, patient stated he was not doing treatments at this timePatient is from [place] with a complaint of chronic "lung pain". Patient has stage 4 lung cancer and was medicated with oxycodone 5mg  at the facility and the patient was not satisfied with the result. However he rates his pain as a 3. Patient has multiple complaints of dehydration, back pain, and blurred vision due to dehydration. EXAM: CT CHEST, ABDOMEN AND PELVIS WITHOUT CONTRAST TECHNIQUE: Multidetector CT imaging of the chest, abdomen and pelvis was performed following the standard protocol without IV contrast. COMPARISON:  None. FINDINGS: CT CHEST Neck base and axilla: Mildly enlarged left neck base  lymph nodes, largest measuring 13 mm in short axis. No axillary masses or adenopathy. Mediastinum and hila: Heart normal in size configuration. There are dense coronary artery calcifications. Ascending aorta is prominent measuring 3.8 x 3.6 cm. Multiple sub cm mediastinal lymph nodes. No pathologically enlarged lymph nodes. Lungs and pleura: There is complex fluid containing bubbles of air mid extends from the left apex along the left lateral and posterior hemi thorax the left lung base. A chest tube lies within this collection. This may all be pleural or reflect combination of lung necrosis and empyema air. The underlying left lung is mostly opacified. Aerated portions show irregular interstitial thickening hazy areas of airspace opacity. On the right common there is irregular interstitial thickening with ill-defined small nodules, largest in the left upper lobe, image 23, series 4 measuring 6 mm. Associated with a moderate pleural effusion. CT ABDOMEN AND PELVIS Liver: There are multiple ill-defined low-density masses consistent widespread metastatic disease. Hit in the posterior segment of the right lobe, there is an ill-defined  lesion that measures approximately 3.9 x 1.9 cm transversely. In the central liver between the medial segment of the left lobe and anterior segment of the right lobe there is a 2.9 cm lesion. Spleen:  Unremarkable. Gallbladder, biliary tree and pancreas: Gallbladder surgically absent. There is a biliary stent. Another stent is seen within the duodenum prominent and mildly enlarged lymph nodes are noted along the gastrohepatic ligament, largest measuring 12 mm in short axis. There are prominent peripancreatic lymph nodes. No convincing pancreatic mass or inflammation. Adrenal glands:  No adrenal masses. Kidneys, ureters, bladder: 4 cm midpole right renal low-attenuation mass consistent with a cyst. Left renal cortical thinning. No renal stones. No hydronephrosis. Ureters are normal course  and caliber. Bladder is unremarkable. Ascites: Small amount of ascites collects adjacent to the liver on and in the pelvis. Gastrointestinal: Mild distention of the right colon and transverse colon with air and stool. No bowel obstruction. No bowel wall thickening or inflammatory changes. Stent extends throughout the second portion duodenum. Surrounding soft tissues. There is diffuse soft tissue edema that is most prominent along the flanks. MUSCULOSKELETAL No osteoblastic or osteolytic lesions. IMPRESSION: 1. Complex fluid collection in the left hemi thorax consistent with an empyema. There is likely associated necrotic pneumonia. Additional left lung consolidation is likely combination of infection and atelectasis. 2. Moderate right pleural effusion. 3. Irregular areas of interstitial thickening with ill-defined nodules, largest in the right upper lobe measuring 6 mm. This is concerning for metastatic disease. Left neck base adenopathy also concerning metastatic disease although this could be reactive. 4. Numerous low-density liver lesions consistent with widespread liver metastatic disease. This is likely from the patient's reported duodenal carcinoma. Patient has a stent the spans the second portion the duodenum, as well is a biliary stent. There is presumed metastatic peripancreatic and gastrohepatic ligament adenopathy. 5. There is also small amount ascites and diffuse soft tissue edema consistent with anasarca. Electronically Signed   By: Lajean Manes M.D.   On: 03/31/2015 21:42   Ct Chest Wo Contrast  04/04/2015  CLINICAL DATA:  Patient was admitted for sepsis, also has hx of liver ca 08/2014, GERD, kidney stones, HTN, diabetic, cancer of duodenum 10/2014, patient stated he was not doing treatments at this timePatient [place] a [place] of chronic "lung pain". Patient has stage 4 lung cancer and was medicated with oxycodone 5mg  at the facility and the patient was not  satisfied with the result. However he rates his pain as a 3. Patient has multiple complaints of dehydration, back pain, and blurred vision due to dehydration. EXAM: CT CHEST, ABDOMEN AND PELVIS WITHOUT CONTRAST TECHNIQUE: Multidetector CT imaging of the chest, abdomen and pelvis was performed following the standard protocol without IV contrast. COMPARISON:  None. FINDINGS: CT CHEST Neck base and axilla: Mildly enlarged left neck base lymph nodes, largest measuring 13 mm in short axis. No axillary masses or adenopathy. Mediastinum and hila: Heart normal in size configuration. There are dense coronary artery calcifications. Ascending aorta is prominent measuring 3.8 x 3.6 cm. Multiple sub cm mediastinal lymph nodes. No pathologically enlarged lymph nodes. Lungs and pleura: There is complex fluid containing bubbles of air mid extends from the left apex along the left lateral and posterior hemi thorax the left lung base. A chest tube lies within this collection. This may all be pleural or reflect combination of lung necrosis and empyema air. The underlying left lung is mostly opacified. Aerated portions show irregular interstitial thickening  hazy areas of airspace opacity. On the right common there is irregular interstitial thickening with ill-defined small nodules, largest in the left upper lobe, image 23, series 4 measuring 6 mm. Associated with a moderate pleural effusion. CT ABDOMEN AND PELVIS Liver: There are multiple ill-defined low-density masses consistent widespread metastatic disease. Hit in the posterior segment of the right lobe, there is an ill-defined lesion that measures approximately 3.9 x 1.9 cm transversely. In the central liver between the medial segment of the left lobe and anterior segment of the right lobe there is a 2.9 cm lesion. Spleen:  Unremarkable. Gallbladder, biliary tree and pancreas: Gallbladder surgically absent. There is a biliary stent. Another stent is seen within the duodenum  prominent and mildly enlarged lymph nodes are noted along the gastrohepatic ligament, largest measuring 12 mm in short axis. There are prominent peripancreatic lymph nodes. No convincing pancreatic mass or inflammation. Adrenal glands:  No adrenal masses. Kidneys, ureters, bladder: 4 cm midpole right renal low-attenuation mass consistent with a cyst. Left renal cortical thinning. No renal stones. No hydronephrosis. Ureters are normal course and caliber. Bladder is unremarkable. Ascites: Small amount of ascites collects adjacent to the liver on and in the pelvis. Gastrointestinal: Mild distention of the right colon and transverse colon with air and stool. No bowel obstruction. No bowel wall thickening or inflammatory changes. Stent extends throughout the second portion duodenum. Surrounding soft tissues. There is diffuse soft tissue edema that is most prominent along the flanks. MUSCULOSKELETAL No osteoblastic or osteolytic lesions. IMPRESSION: 1. Complex fluid collection in the left hemi thorax consistent with an empyema. There is likely associated necrotic pneumonia. Additional left lung consolidation is likely combination of infection and atelectasis. 2. Moderate right pleural effusion. 3. Irregular areas of interstitial thickening with ill-defined nodules, largest in the right upper lobe measuring 6 mm. This is concerning for metastatic disease. Left neck base adenopathy also concerning metastatic disease although this could be reactive. 4. Numerous low-density liver lesions consistent with widespread liver metastatic disease. This is likely from the patient's reported duodenal carcinoma. Patient has a stent the spans the second portion the duodenum, as well is a biliary stent. There is presumed metastatic peripancreatic and gastrohepatic ligament adenopathy. 5. There is also small amount ascites and diffuse soft tissue edema consistent with anasarca. Electronically Signed   By: Lajean Manes M.D.   On:  03/27/2015 21:42   Dg Chest Port 1 View  03/19/2015  CLINICAL DATA:  70 year old male with chronic lung pain and stage IV lung cancer EXAM: PORTABLE CHEST 1 VIEW COMPARISON:  Prior chest x-ray 03/12/2015 FINDINGS: Right IJ approach single-lumen power injectable port catheter. Catheter tip overlies the superior cavoatrial junction. The patient is rotated to the left. There is chronic volume loss with right-to-left shift of the cardiac and mediastinal structures. Similar appearance of the left lung with a small portion of aerated left upper lobe. The remainder of the lung is consolidated. Chronic bronchitic change and interstitial prominence noted throughout the aerated right lung. No acute osseous abnormality. IMPRESSION: Stable chest x-ray without evidence of acute cardiopulmonary process. Electronically Signed   By: Jacqulynn Cadet M.D.   On: 03/16/2015 14:40    Scheduled Meds: . antiseptic oral rinse  7 mL Mouth Rinse BID  . Chlorhexidine Gluconate Cloth  6 each Topical Q0600  . feeding supplement (GLUCERNA SHAKE)  237 mL Oral TID BM  . ferrous sulfate  325 mg Oral BID WC  . insulin aspart  0-9 Units Subcutaneous TID WC  .  levothyroxine  12.5 mcg Intravenous Daily  . mupirocin ointment  1 application Nasal BID  . pantoprazole (PROTONIX) IV  40 mg Intravenous Q12H  . piperacillin-tazobactam  3.375 g Intravenous Q8H  . sodium chloride  3 mL Intravenous Q12H  . vancomycin  125 mg Oral QID  . vancomycin  1,000 mg Intravenous Q24H   Continuous Infusions: . sodium chloride 100 mL/hr at 03/19/15 1110  . norepinephrine (LEVOPHED) Adult infusion 4 mcg/min (03/16/2015 0610)     Time spent: 25 minutes    Sherryn Pollino  [facility] Pager [phone]. If 7PM-7AM, please contact night-coverage at [email or web address], password [facility] 04/06/2015, 8:29 AM  LOS: 2 days

## 2015-03-21 ENCOUNTER — Encounter (HOSPITAL_COMMUNITY): Payer: Self-pay | Admitting: Internal Medicine

## 2015-03-21 DIAGNOSIS — N179 Acute kidney failure, unspecified: Secondary | ICD-10-CM

## 2015-03-21 LAB — GLUCOSE, CAPILLARY
GLUCOSE-CAPILLARY: 58 mg/dL — AB (ref 65–99)
GLUCOSE-CAPILLARY: 115 mg/dL — AB (ref 65–99)
GLUCOSE-CAPILLARY: 140 mg/dL — AB (ref 65–99)
GLUCOSE-CAPILLARY: 191 mg/dL — AB (ref 65–99)
Glucose-Capillary: 115 mg/dL — ABNORMAL HIGH (ref 65–99)

## 2015-03-21 LAB — CBC
HCT: 36.7 % — ABNORMAL LOW (ref 39.0–52.0)
Hemoglobin: 11.7 g/dL — ABNORMAL LOW (ref 13.0–17.0)
MCH: 29.5 pg (ref 26.0–34.0)
MCHC: 31.9 g/dL (ref 30.0–36.0)
MCV: 92.4 fL (ref 78.0–100.0)
PLATELETS: 67 10*3/uL — AB (ref 150–400)
RBC: 3.97 MIL/uL — ABNORMAL LOW (ref 4.22–5.81)
RDW: 19.7 % — AB (ref 11.5–15.5)
WBC: 18.2 10*3/uL — AB (ref 4.0–10.5)

## 2015-03-21 MED ORDER — MORPHINE SULFATE (PF) 2 MG/ML IV SOLN
1.0000 mg | INTRAVENOUS | Status: DC | PRN
  Administered 2015-03-21: 2 mg via INTRAVENOUS
  Administered 2015-03-21: 1 mg via INTRAVENOUS
  Administered 2015-03-21 – 2015-03-22 (×3): 2 mg via INTRAVENOUS
  Filled 2015-03-21 (×5): qty 1

## 2015-03-21 MED ORDER — LORAZEPAM 2 MG/ML IJ SOLN
0.5000 mg | INTRAMUSCULAR | Status: DC | PRN
  Administered 2015-03-21 (×2): 0.5 mg via INTRAVENOUS
  Filled 2015-03-21 (×2): qty 1

## 2015-03-21 MED ORDER — DEXTROSE 50 % IV SOLN: 50.0000 mL | Freq: Once | INTRAVENOUS | Status: AC

## 2015-03-21 MED ORDER — DEXTROSE 50 % IV SOLN
INTRAVENOUS | Status: AC
  Administered 2015-03-21: 50 mL
  Filled 2015-03-21: qty 50

## 2015-03-21 NOTE — Progress Notes (Signed)
PULMONARY / CRITICAL CARE MEDICINE   Name: Manuel Barr MRN: [id number] DOB: [birth year]    ADMISSION DATE:  04/01/2015 CONSULTATION DATE:  03/19/14  REFERRING MD:  Dr. Sanjuana Letters   CHIEF COMPLAINT:  Hypotension, Concern for Empyema   SUBJECTIVE:  RN reports slowed stool output from flexi-seal, increased levophed requirements to 18 mcg, now requiring O2 at 5L  VITAL SIGNS: BP 112/68 mmHg  Pulse 64  Temp(Src) 99.3 F (37.4 C) (Oral)  Resp 18  Ht 5\' 2"  (1.575 m)  Wt 163 lb 2.3 oz (74 kg)  BMI 29.83 kg/m2  SpO2 88%  HEMODYNAMICS:    VENTILATOR SETTINGS:    INTAKE / OUTPUT: I/O last 3 completed shifts: In: 4664.2 [I.V.:3714.2; IV Piggyback:950] Out: [id number] [Urine:725; Drains:200; Stool:150]  PHYSICAL EXAMINATION: General:  Chronically ill appearing male, cachectic Neuro:  Drowsy, arouses, intermittent confusion, MAE, generalized weakness  HEENT:  MM pink/dry, no jvd, fair dentition  Cardiovascular:  s1s2 rrr, tachycardic, ST on monitor Lungs:  Tachypnea, minimal air movement on L, crackles on R Abdomen:  NTND, bsx4 active Musculoskeletal:  No acute deformities  Skin: warm/dry, BLE 1+ pitting edema, heels boggy/pink  LABS:  BMET  Recent Labs Lab 04/02/2015 1455 03/19/15 1145 04/04/2015 0430  NA 138 136 138  K 5.4* 4.7 4.2  CL 103 105 106  CO2 18* 21* 22  BUN 53* 58* 58*  CREATININE 2.02* 2.16* 2.13*  GLUCOSE 170* 125* 93    Electrolytes  Recent Labs Lab 03/12/2015 1455 03/19/15 0700 03/19/15 1145 04/05/2015 0430  CALCIUM 7.8*  --  7.0* 7.3*  MG  --  1.8  --  1.9  PHOS  --  4.4  --  3.7    CBC  Recent Labs Lab 03/19/15 0700 03/31/2015 0430 03/21/15 0405  WBC 29.3* 25.9* 18.2*  HGB 7.9* 11.2* 11.7*  HCT 25.1* 34.1* 36.7*  PLT 134* 103* 67*    Coag's  Recent Labs Lab 04/06/2015 1455 03/19/15 0700 03/19/2015 0430  APTT  --  31  --   INR 1.24 1.26 1.29    Sepsis Markers  Recent Labs Lab 03/26/2015 1639  LATICACIDVEN 2.83*    ABG No results  for input(s): PHART, PCO2ART, PO2ART in the last 168 hours.  Liver Enzymes  Recent Labs Lab 03/17/2015 1455 03/19/15 1145 03/30/2015 0430  AST 52* 47* 74*  ALT 38 33 38  ALKPHOS 909* 703* 807*  BILITOT 2.6* 1.9* 3.5*  ALBUMIN 1.6* 1.3* 1.6*    Cardiac Enzymes  Recent Labs Lab 03/17/2015 1455  TROPONINI 0.71*    Glucose  Recent Labs Lab 03/27/2015 0826 03/21/2015 0911 04/06/2015 1150 03/25/2015 1504 04/01/2015 1715 03/26/2015 2022  GLUCAP 63* 96 82 87 72 79    Imaging Dg Chest Port 1 View  04/10/2015  CLINICAL DATA:  History of empyema. EXAM: PORTABLE CHEST 1 VIEW COMPARISON:  CT 03/21/2015. FINDINGS: PowerPort catheter noted with lead tip in the right atrium. Left chest tube in stable position. Left lung diffuse infiltrate and left pleural fluid collection again noted. Diffuse interstitial prominence right lung. This may be related pneumonitis however component congestive heart failure cannot be excluded. Right base atelectasis. No pneumothorax . No acute bony abnormality IMPRESSION: 1. Power port catheter left chest tube in stable position. 2. Persistent diffuse left lung infiltrate and/or pleural fluid collection/empyema. 3. Increased interstitial markings noted throughout the right lung. These changes may be related to pneumonitis. Congestive heart failure cannot be excluded . Right base subsegmental atelectasis . Electronically Signed  By: Antler   On: 03/21/2015 11:17     STUDIES:  CT ABD/Pelvis 1/7  >> numerous low-density liver lesions c/w widespread liver metastatic disease, stent that spans the second portion of the duodenum as well as biliary stent, presumed metastatic peripancreatic and gastrohepatic ligament adenopathy CT Chest >> complex fluid collection in L hemithorax c/w empyema, associated necrotic pneumonia, left lung consolidation / atelectasis, moderate R pleural effusion, irregular areas of interstitial thickening with ill defined nodules concerning for  metastatic disease, L neck base adenopathy also concerning for metastatic disease  CULTURES: BCx2 1/7 >>  C-Diff 1/8 >> POSITIVE  Pleural Fluid GS 1/8 >> abundant wbc's  ANTIBIOTICS: Vanco IV 1/7 >>  Zosyn 1/7 >> Vanco PO 1/8 >>   SIGNIFICANT EVENTS: 01/07  Admit with weakness, poor PO intake.  Concern for sepsis of unclear etiology  01/08  Diarrhea, melena.  Tx to ICU.  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  Initial evaluation concerning for sepsis - AKI, elevated lactic acid, hypotension.  Further work up positive for C-Diff, melena, and concern for necrotizing PNA on L / empyema.    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, Pleural Space Malignant Pleural Effusion s/p Pleurx Catheter - 01/2015 Empyema  OSA P:   Oxygen as needed to support saturations > 92% Drain Pleurx QOD & PRN dyspnea Intermittent CXR  ABX as below Follow pleural cultures DNR/DNI  CARDIOVASCULAR A:  Septic Shock - in setting of C-Diff, empyema Hx HLD, HTN P:  ICU monitoring of hemodynamics Wean levophed to off for MAP > 65 NS to KVO Cortisol wnl DNR/DNI  RENAL A:   AKI - in setting of volume depletion with diarrhea (C-Diff) Hx Renal Calculi P:   Trend BMP / UOP  Replace electrolytes as indicated   GASTROINTESTINAL A:   Metastatic Duodenal Cancer with chronic GIB - s/p EGD on 1/9 with reflux esophagitis in distal esophagus, food residue in gastric body without evidence of gastric outlet obstruction, ulcerated duodenal mucosa / tumor  Melena  Protein Calorie Malnutrition  GERD P:   Nutritional supplementation See Heme  D/C protonix given C-diff  Pepcid QD based on renal function  Carb modified diet  as tolerated Monitor stool volume Hold bowel regimen with diarrhea   HEMATOLOGIC A:   Anemia - in setting of chronic GIB with metastatic duodenal ulcer Thrombocytopenia  Hx DVT / PE s/p IVC Filter P:  Trend CBC Transfuse for Hgb <% or active bleeding  No anticoagulation due to GIB  INFECTIOUS A:   C-Diff Colitis Empyema / Necrotizing PNA  P:   ABX as above  Follow cultures  Trend lactic acid   ENDOCRINE A:   DM II    P:   SSI   NEUROLOGIC A:   Pain - secondary to malignancy  P:   RASS goal: n/a PRN dilaudid, oxycodone for pain    FAMILY  - Updates: Wife updated at bedside 1/10.  Continue full medical care but DNR/DNI in the event of arrest.    - Inter-disciplinary family meet or Palliative Care meeting due by:  [place] 1/9.      Noe Gens, NP-C [place] Pgr: [phone] or if no answer [phone] 03/21/2015, 8:15 AM

## 2015-03-21 NOTE — Progress Notes (Signed)
   03/21/15 1300  Clinical Encounter Type  Visited With Patient and family together  Visit Type Initial;Psychological support;Spiritual support;Critical Care  Referral From Nurse  Consult/Referral To Chaplain  Spiritual Encounters  Spiritual Needs Emotional;Other (Comment) (Pastoral Conversation/Support)  Stress Factors  Patient Stress Factors None identified  Family Stress Factors Health changes;Major life changes;Loss   I visited with the patient per request by the nurse who stated that the patient was not doing well. I noted that there had been visitors outside of the patient's room for awhile, and the wife was at the bedside. I sanitized my hands and put on the necessary gown and gloves and entered the room.  The patient was agitated and trying to get comfortable. The wife seemed slightly frustrated that the patient wouldn't lay down and rest, but remained loving in her interactions with him. The understands the gravity of the patient's situation, but said the she is doing ok at the moment. The wife said that she thinks that she will be a wreck later in the day if anything happens to her husband, but exhibits appropriate grief.  Please notify the Chaplain when needed.   [place] M.Div.

## 2015-03-21 NOTE — Progress Notes (Signed)
Pharmacy Antibiotic Follow-up Note  Manuel Barr is a 70 y.o. year-old male admitted on 03/12/2015.  The patient is currently on day #3 of vancomycin and zosyn for empyema.  Also on day #2 of oral vancomycin for CDAD. Renal failure noted on previous labs, no BMP this am. WBC is improving  Assessment/Plan:  Continue vancomycin 1gm IV q24h at this time.  Adjust based on changes in renal function  Trough later this week if continued  Continue zosyn 3/375 gm IV q8h over 4h infusion  Pleural fluid culture pending  Labs order for am to evaluate Scr  Temp (24hrs), Avg:98.3 F (36.8 C), Min:97.4 F (36.3 C), Max:99.3 F (37.4 C)   Recent Labs Lab 03/19/2015 1455 03/19/15 0700 04/11/2015 0430 03/21/15 0405  WBC 24.8* 29.3* 25.9* 18.2*    Recent Labs Lab 03/16/2015 1455 03/19/15 1145 03/26/2015 0430  CREATININE 2.02* 2.16* 2.13*   Estimated Creatinine Clearance: 29.7 mL/min (by C-G formula based on Cr of 2.13).    Allergies  Allergen Reactions  . Codeine Nausea And Vomiting  . Other     Pt is on a Mechanical Soft Diet, has a stint in stomach.    Antimicrobials this admission: 1/7 Cefepime >>1/7  1/7 Vanco >>  1/7 Zosyn >>  1/8 Flagyl >> 1/8   Levels/dose changes this admission:  Microbiology results: 1/8 PO vanco >>  1/7 blood: ngtd  1/8 pl fluid:  1/8 MRSA PCR: Positive  1/8 C diff: Positive  Thank you for allowing pharmacy to be a part of this patient's care.  Doreene Eland, PharmD, BCPS.   Pager: [phone] 03/21/2015 12:24 PM

## 2015-03-21 NOTE — Progress Notes (Signed)
[facility] PROGRESS NOTE  Manuel Barr [id number] DOB: [birth year] DOA: 04/06/2015 PCP: Shiela Mayer, PA  Assessment&Care Plan Day of Admission 03/21/2015 Manuel Barr is a 70 year old male nursing home resident with metastatic duodenal cancer causing chronic blood loss and iron deficiency anemia, recent pulmonary embolism/dvt status post IVC filter placement, diabetes mellitus type 2, essential hypertension, severe protein caloric malnutrition, among other medical problems, who comes in today with complaints of generalized weakness associated with poor oral intake and he is found to be septic with a white count of 24,800, lactic acidosis, acute kidney injury and hyperkalemia, BUN/creatinine 53/2.02(normal last month) potassium 5.4. Source of sepsis is not clear at this point, but possibly healthcare associated pneumonia-UA/urine culture pending, chest x-ray shows "Slight improved aeration of the left lung without other significant change. Continued left lower lung airspace disease/ consolidation and left pleural effusion", ? Cholangitis. Patient will be admitted to telemetry for further management including septic workup-CT chest abdomen and pelvis without contrast, blood culture, UA/urine culture. Meanwhile, he will receive IV fluids(expect acute kidney injury/lactic acidosis/hyperkalemia to improve with hydration), empiric antibiotics and anti-emetics as necessary. Patient unfortunately in severe discomfort and not able to provide much history to go with. We will need to evaluate the history as he gets better, including addressing goals of care etc. He is full code for now. Subsequent developments after admission 03/19/15: Blood pressure remained low despite IV fluids/antibiotics. Patient noted to have loose bowel movements with melena. Hemoglobin dropped to 7.9g/dl. Bun/cr is 53/2.02. White count has increased to 29,300. TSH elevated at 7.05. CT chest abdomen pelvis shows"1. Complex fluid collection  in the left hemi thorax consistent with an empyema. There is likely associated necrotic pneumonia. Additional left lung consolidation is likely combination of infection and atelectasis. 2. Moderate right pleural effusion. 3. Irregular areas of interstitial thickening with ill-defined nodules, largest in the right upper lobe measuring 6 mm. This is concerning for metastatic disease. Left neck base adenopathy also concerning metastatic disease although this could be reactive. 4. Numerous low-density liver lesions consistent with widespread liver metastatic disease. This is likely from the patient's reported duodenal carcinoma. Patient has a stent the spans the second portion the duodenum, as well is a biliary stent. There is presumed metastatic peripancreatic and gastrohepatic ligament adenopathy. 5. There is also small amount ascites and diffuse soft tissue edema consistent with anasarca". Will transfer patient to the stepdown unit, transfuse PRBC, check stool for C. difficile colitis/check free T4 continue IV fluids/antibiotics(including Flagyl)/give pressors as needed for map> 65, give Synthroid, drain Pleurx catheter, consult pulmonary and critical care in a.m. regarding abnormal CT findings, and also consult GI. Patient's condition is critical. Time spent for this critical care follow-up is approximately 45 minutes. 03/27/2015: needed Levophed last night. Hemoglobin has improved to 11g/dl. Still has diarrhea. Renal function slightly improved. MRSA PCR positive. Await blood culture/Pleurx fluid culture/C. Difficile PCR. Patient says he feels slightly better. Discussed care plan with wife At bedside. Consulted pulmonary and critical care medicine. Appreciate help. We'll continue current management including antibiotics/PPI/pressors, and follow critical care medicine recommendations. Will consult GI regarding Acute GI Bleeding. Patient sees oncology/GI/pulmonary at the [facility] in [place]. 03/21/15: Appreciate  GI/pulmonary. Patient appears to be deteriorating. Prognosis is poor. Will continue management per pulmonary/GI. Plan Sepsis (HCC)/Leukocytosis/Acute kidney injury (HCC)/Lactic acidosis/Hyperkalemia/HCAP (healthcare-associated pneumonia)-? Empyema  Continue management per pulmonary Duodenal cancer (HCC)/Pulmonary embolism (HCC)/Iron deficiency anemia due to chronic blood loss  No anticoagulation as bleeding risk. Fortunately has IVC filter.  Responded to PRBC  transfusion  PPI  Follow GI recommendations Diabetes mellitus, type 2 (HCC)/hypothyroidism  Follow hemoglobin A1c  Continue SSI/Synthroid Migraine/essential HTN (hypertension)  Monitor Protein-calorie malnutrition, severe  Glucerna DVT/GI Prophylaxis  SCDs  PPI Family Communication: Spoke with wife [name] at bedside on 03/12/2015.  Code Status   DNR  Likely DC  To be determined dependent on patient's clinical progress  Consultants:  Pulmonary  GI  Procedures:    Antibiotics:  Vancomycin 03/21/2015>  Zosyn 03/24/2015>  Flagyl 03/19/2015>  HPI/Subjective: Unresponsive  Objective: Filed Vitals:   03/21/15 2157 03/21/15 2200  BP: 102/71 107/60  Pulse:    Temp:    Resp: 14 26    Intake/Output Summary (Last 24 hours) at 03/21/15 2215 Last data filed at 03/21/15 2106  Gross per 24 hour  Intake 1296.64 ml  Output    425 ml  Net 871.64 ml   Filed Weights   03/19/15 0450 03/29/2015 0430 03/21/15 0430  Weight: 67.4 kg (148 lb 9.4 oz) 72.5 kg (159 lb 13.3 oz) 74 kg (163 lb 2.3 oz)    Exam:   General:  Unresponsive.  Cardiovascular: S1-S2 normal. No murmurs. Pulse regular.  Respiratory: Good air entry bilaterally. No rhonchi or rales.  Abdomen: Soft and nontender. Normal bowel sounds. No organomegaly.  Musculoskeletal: No pedal edema   Neurological: Unresponsive  Data Reviewed: Basic Metabolic Panel:  Recent Labs Lab 03/23/2015 1455 03/19/15 0700 03/19/15 1145 03/28/2015 0430  NA  138  --  136 138  K 5.4*  --  4.7 4.2  CL 103  --  105 106  CO2 18*  --  21* 22  GLUCOSE 170*  --  125* 93  BUN 53*  --  58* 58*  CREATININE 2.02*  --  2.16* 2.13*  CALCIUM 7.8*  --  7.0* 7.3*  MG  --  1.8  --  1.9  PHOS  --  4.4  --  3.7   Liver Function Tests:  Recent Labs Lab 03/19/2015 1455 03/19/15 1145 03/16/2015 0430  AST 52* 47* 74*  ALT 38 33 38  ALKPHOS 909* 703* 807*  BILITOT 2.6* 1.9* 3.5*  PROT 4.6* 3.9* 4.5*  ALBUMIN 1.6* 1.3* 1.6*    Recent Labs Lab 04/09/2015 1455  LIPASE 11   No results for input(s): AMMONIA in the last 168 hours. CBC:  Recent Labs Lab 04/09/2015 1455 03/19/15 0700 04/11/2015 0430 03/21/15 0405  WBC 24.8* 29.3* 25.9* 18.2*  NEUTROABS 22.6* 25.7* 23.5*  --   HGB 8.3* 7.9* 11.2* 11.7*  HCT 26.5* 25.1* 34.1* 36.7*  MCV 93.0 95.1 90.9 92.4  PLT 151 134* 103* 67*   Cardiac Enzymes:  Recent Labs Lab 04/05/2015 1455  TROPONINI 0.71*   BNP (last 3 results)  Recent Labs  03/05/15 1530 03/20/2015 1456  BNP 113.2* 113.0*    ProBNP (last 3 results) No results for input(s): PROBNP in the last 8760 hours.  CBG:  Recent Labs Lab 03/21/15 0738 03/21/15 0812 03/21/15 1155 03/21/15 1556 03/21/15 2203  GLUCAP 58* 115* 115* 140* 191*    Recent Results (from the past 240 hour(s))  Culture, blood (routine x 2)     Status: None (Preliminary result)   Collection Time: 04/09/2015  4:00 PM  Result Value Ref Range Status   Specimen Description RIGHT ANTECUBITAL  Final   Special Requests BOTTLES DRAWN AEROBIC AND ANAEROBIC 5CC  Final   Culture   Final    NO GROWTH 3 DAYS Performed at [facility]  Report Status PENDING  Incomplete  Culture, blood (routine x 2)     Status: None (Preliminary result)   Collection Time: 04/10/2015  4:35 PM  Result Value Ref Range Status   Specimen Description BLOOD  Final   Special Requests BOTTLES DRAWN AEROBIC AND ANAEROBIC 5CC  Final   Culture   Final    NO GROWTH 3 DAYS Performed at [facility]    Report Status PENDING  Incomplete  C difficile quick scan w PCR reflex     Status: Abnormal   Collection Time: 03/19/15  5:29 PM  Result Value Ref Range Status   C Diff antigen POSITIVE (A) NEGATIVE Final   C Diff toxin POSITIVE (A) NEGATIVE Final   C Diff interpretation Positive for toxigenic C. difficile  Final    Comment: CRITICAL RESULT CALLED TO, READ BACK BY AND VERIFIED WITH: Kathyrn Lass RN 2030 03/19/15 A NAVARRO   MRSA PCR Screening     Status: Abnormal   Collection Time: 03/19/15  5:31 PM  Result Value Ref Range Status   MRSA by PCR POSITIVE (A) NEGATIVE Final    Comment:        The GeneXpert MRSA Assay (FDA approved for NASAL specimens only), is one component of a comprehensive MRSA colonization surveillance program. It is not intended to diagnose MRSA infection nor to guide or monitor treatment for MRSA infections. RESULT CALLED TO, READ BACK BY AND VERIFIED WITH: Bethann Humble RN @ [phone] ON 03/12/2015 BY C DAVIS   Culture, body fluid-bottle     Status: None (Preliminary result)   Collection Time: 03/19/15  9:45 PM  Result Value Ref Range Status   Specimen Description FLUID PLEURAL  Final   Special Requests NONE  Final   Culture   Final    NO GROWTH 1 DAY Performed at [facility]    Report Status PENDING  Incomplete  Gram stain     Status: None   Collection Time: 03/19/15  9:45 PM  Result Value Ref Range Status   Specimen Description FLUID PLEURAL  Final   Special Requests NONE  Final   Gram Stain   Final    ABUNDANT WBC PRESENT,BOTH PMN AND MONONUCLEAR NO ORGANISMS SEEN Performed at [facility]    Report Status 03/19/2015 FINAL  Final     Studies: Dg Chest Port 1 View  03/15/2015  CLINICAL DATA:  History of empyema. EXAM: PORTABLE CHEST 1 VIEW COMPARISON:  CT 04/02/2015. FINDINGS: PowerPort catheter noted with lead tip in the right atrium. Left chest tube in stable position. Left lung diffuse infiltrate and left pleural fluid  collection again noted. Diffuse interstitial prominence right lung. This may be related pneumonitis however component congestive heart failure cannot be excluded. Right base atelectasis. No pneumothorax . No acute bony abnormality IMPRESSION: 1. Power port catheter left chest tube in stable position. 2. Persistent diffuse left lung infiltrate and/or pleural fluid collection/empyema. 3. Increased interstitial markings noted throughout the right lung. These changes may be related to pneumonitis. Congestive heart failure cannot be excluded . Right base subsegmental atelectasis . Electronically Signed   By: Marcello Moores  Register   On: 03/25/2015 11:17    Scheduled Meds: . antiseptic oral rinse  7 mL Mouth Rinse BID  . Chlorhexidine Gluconate Cloth  6 each Topical Q0600  . famotidine (PEPCID) IV  20 mg Intravenous Daily  . feeding supplement (GLUCERNA SHAKE)  237 mL Oral TID BM  . ferrous sulfate  325 mg Oral  BID WC  . insulin aspart  0-9 Units Subcutaneous TID WC  . mupirocin ointment  1 application Nasal BID  . piperacillin-tazobactam  3.375 g Intravenous Q8H  . protein supplement  8 oz Oral BID  . sodium chloride  3 mL Intravenous Q12H  . vancomycin  125 mg Oral QID  . vancomycin  1,000 mg Intravenous Q24H   Continuous Infusions: . norepinephrine (LEVOPHED) Adult infusion 18 mcg/min (03/21/15 2105)     Time spent: 25 minutes    Kyndal Heringer  [facility] Pager [phone]. If 7PM-7AM, please contact night-coverage at [email or web address], password [facility] 03/21/2015, 10:15 PM  LOS: 3 days

## 2015-03-22 DIAGNOSIS — R0600 Dyspnea, unspecified: Secondary | ICD-10-CM

## 2015-03-22 DIAGNOSIS — A419 Sepsis, unspecified organism: Secondary | ICD-10-CM | POA: Diagnosis present

## 2015-03-22 DIAGNOSIS — Z515 Encounter for palliative care: Secondary | ICD-10-CM

## 2015-03-22 DIAGNOSIS — D6859 Other primary thrombophilia: Secondary | ICD-10-CM

## 2015-03-22 DIAGNOSIS — Z66 Do not resuscitate: Secondary | ICD-10-CM

## 2015-03-22 DIAGNOSIS — R6521 Severe sepsis with septic shock: Secondary | ICD-10-CM

## 2015-03-22 DIAGNOSIS — C7800 Secondary malignant neoplasm of unspecified lung: Secondary | ICD-10-CM

## 2015-03-22 DIAGNOSIS — R451 Restlessness and agitation: Secondary | ICD-10-CM

## 2015-03-22 LAB — CBC
HEMATOCRIT: 37.4 % — AB (ref 39.0–52.0)
Hemoglobin: 11.7 g/dL — ABNORMAL LOW (ref 13.0–17.0)
MCH: 29.5 pg (ref 26.0–34.0)
MCHC: 31.3 g/dL (ref 30.0–36.0)
MCV: 94.4 fL (ref 78.0–100.0)
Platelets: 44 10*3/uL — ABNORMAL LOW (ref 150–400)
RBC: 3.96 MIL/uL — AB (ref 4.22–5.81)
RDW: 19.7 % — ABNORMAL HIGH (ref 11.5–15.5)
WBC: 36.6 10*3/uL — AB (ref 4.0–10.5)

## 2015-03-22 LAB — COMPREHENSIVE METABOLIC PANEL
ALK PHOS: 685 U/L — AB (ref 38–126)
ALT: 40 U/L (ref 17–63)
AST: 52 U/L — AB (ref 15–41)
Albumin: 1.6 g/dL — ABNORMAL LOW (ref 3.5–5.0)
Anion gap: 13 (ref 5–15)
BILIRUBIN TOTAL: 4.8 mg/dL — AB (ref 0.3–1.2)
BUN: 59 mg/dL — AB (ref 6–20)
CALCIUM: 7.4 mg/dL — AB (ref 8.9–10.3)
CO2: 17 mmol/L — ABNORMAL LOW (ref 22–32)
CREATININE: 2.51 mg/dL — AB (ref 0.61–1.24)
Chloride: 105 mmol/L (ref 101–111)
GFR calc Af Amer: 29 mL/min — ABNORMAL LOW (ref >60)
GFR, EST NON AFRICAN AMERICAN: 25 mL/min — AB (ref >60)
Glucose, Bld: 250 mg/dL — ABNORMAL HIGH (ref 65–99)
Potassium: 4.8 mmol/L (ref 3.5–5.1)
Sodium: 135 mmol/L (ref 135–145)
TOTAL PROTEIN: 4.3 g/dL — AB (ref 6.5–8.1)

## 2015-03-22 LAB — GLUCOSE, CAPILLARY: Glucose-Capillary: 244 mg/dL — ABNORMAL HIGH (ref 65–99)

## 2015-03-22 MED ORDER — MORPHINE SULFATE (PF) 2 MG/ML IV SOLN: 2.0000 mg | INTRAVENOUS | Status: DC | PRN

## 2015-03-22 MED ORDER — LORAZEPAM 2 MG/ML IJ SOLN
1.0000 mg | INTRAMUSCULAR | Status: DC | PRN
Start: 2015-03-22 — End: 2015-03-22

## 2015-03-23 LAB — CULTURE, BLOOD (ROUTINE X 2)
CULTURE: NO GROWTH
CULTURE: NO GROWTH

## 2015-03-25 LAB — CULTURE, BODY FLUID W GRAM STAIN -BOTTLE: Culture: NO GROWTH

## 2015-03-25 LAB — CULTURE, BODY FLUID-BOTTLE

## 2015-03-28 ENCOUNTER — Encounter: Payer: Self-pay | Admitting: *Deleted

## 2015-04-12 NOTE — Progress Notes (Signed)
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   Subjective / 24 H Interval events - unresponsive this morning, appears comfortable.  Assessment/Plan: Principal Problem:   Sepsis ([place]) Active Problems:   Diabetes mellitus, type 2 ([place])   Duodenal cancer (HCC)   HTN (hypertension)   HCAP (healthcare-associated pneumonia)   Pulmonary embolism (HCC)   Anemia   Protein-calorie malnutrition, severe   Iron deficiency anemia due to chronic blood loss   Leukocytosis   Acute kidney injury ([place])   Lactic acidosis   MRSA carrier   Melena   DNR (do not resuscitate)   Palliative care encounter   Dyspnea   Agitation    Septic shoch ([place]) - on pressors - likely in the setting of HCAP, on Vancomycin and Zosyn, antibiotics d/c 1/11 by palliative after [place] discussions. Comfort care  Acute kidney injury ([place]) - in the setting of shock.   HCAP (healthcare-associated pneumonia)-? Empyema  Duodenal cancer (HCC)/Pulmonary embolism (HCC)/Iron deficiency anemia due to chronic blood loss - No anticoagulation as bleeding risk. has IVC filter. - Responded to PRBC transfusion - PPI  ABLA due to GI bleed - EGD 1/9 with GI bleed from known duodenal tumor, no role for endoscopic intervention per GI - poor prognosis, appreciate palliative consult. - Hb stable,  received 2u pRBC on 1/8  Diabetes mellitus, type 2 (HCC)/hypothyroidism - hemoglobin A1c 5.9 - Continue SSI/Synthroid  Migraine/essential HTN (hypertension)  Protein-calorie malnutrition, severe  Diet:   Fluids: none  DVT Prophylaxis: SCD  Code Status: DNR Family Communication: no family bedside  Disposition Plan: anticipate in hospital death   Consultants:  PCCM  GI  Palliative  Procedures:  EGD 1/9   Antibiotics Vancomycin 1/7 >> 1/11 Vancomycin po 1/7 >> 1/11   Studies  No results found.  Objective  Filed Vitals:   Apr 18, 2015 0945 04-18-2015 1000 April 18, 2015 1015 04-18-2015 1030  BP: 85/50 87/51 86/49  84/49  Pulse: 104 105 106 106  Temp:      TempSrc:      Resp: 17 17 17 17   Height:      Weight:      SpO2: 94% 93% 94% 94%    Intake/Output Summary (Last 24 hours) at 04-18-2015 1143 Last data filed at 04-18-2015 1005  Gross per 24 hour  Intake 1322.8 ml  Output    325 ml  Net  997.8 ml   Filed Weights   03/29/2015 0430 03/21/15 0430 04/18/15 0416  Weight: 72.5 kg (159 lb 13.3 oz) 74 kg (163 lb 2.3 oz) 74.4 kg (164 lb 0.4 oz)   Exam:  GENERAL: unresponsive, terminal breathing  LUNGS: coarse breath sounds  HEART: RRR without MRG  ABDOMEN: soft, non tender  EXTREMITIES: no clubbing / cyanosis  NEUROLOGIC: unresponsive  SKIN: pale  Data Reviewed: Basic Metabolic Panel:  Recent Labs Lab 04/06/2015 1455 03/19/15 0700 03/19/15 1145 03/12/2015 0430 04-18-2015 0410  NA 138  --  136 138 135  K 5.4*  --  4.7 4.2 4.8  CL 103  --  105 106 105  CO2 18*  --  21* 22 17*  GLUCOSE 170*  --  125* 93 250*  BUN 53*  --  58* 58* 59*  CREATININE 2.02*  --  2.16* 2.13* 2.51*  CALCIUM 7.8*  --  7.0* 7.3* 7.4*  MG  --  1.8  --  1.9  --   PHOS  --  4.4  --  3.7  --    Liver Function Tests:  Recent Labs Lab 03/31/2015 1455 03/19/15 1145 04/09/2015 0430 2015/04/09 0410  AST 52* 47* 74* 52*  ALT 38 33 38 40  ALKPHOS 909* 703* 807* 685*  BILITOT 2.6* 1.9* 3.5*  4.8*  PROT 4.6* 3.9* 4.5* 4.3*  ALBUMIN 1.6* 1.3* 1.6* 1.6*    Recent Labs Lab 03/27/2015 1455  LIPASE 11   CBC:  Recent Labs Lab 03/17/2015 1455 03/19/15 0700 03/20/15 0430 03/21/15 0405 04-09-2015 0410  WBC 24.8* 29.3* 25.9* 18.2* 36.6*  NEUTROABS 22.6* 25.7* 23.5*  --   --   HGB 8.3* 7.9* 11.2* 11.7* 11.7*  HCT 26.5* 25.1* 34.1* 36.7* 37.4*  MCV 93.0 95.1 90.9 92.4 94.4  PLT 151 134* 103* 67* 44*   Cardiac Enzymes:  Recent Labs Lab 04/01/2015 1455  TROPONINI 0.71*   BNP (last 3 results)  Recent Labs  03/05/15 1530 04/09/2015 1456  BNP 113.2* 113.0*   CBG:  Recent Labs Lab 03/21/15 0812 03/21/15 1155 03/21/15 1556 03/21/15 2203 April 09, 2015 0858  GLUCAP 115* 115* 140* 191* 244*    Recent Results (from the past 240 hour(s))  Culture, blood (routine x 2)     Status: None (Preliminary result)   Collection Time: 04/10/2015  4:00 PM  Result Value Ref Range Status   Specimen Description RIGHT ANTECUBITAL  Final   Special Requests BOTTLES DRAWN AEROBIC AND ANAEROBIC 5CC  Final   Culture   Final    NO GROWTH 4 DAYS Performed at [facility]    Report Status PENDING  Incomplete  Culture, blood (routine x 2)     Status: None (Preliminary result)   Collection Time: 03/29/2015  4:35 PM  Result Value Ref Range Status   Specimen Description BLOOD  Final   Special Requests BOTTLES DRAWN AEROBIC AND ANAEROBIC 5CC  Final   Culture   Final    NO GROWTH 4 DAYS Performed at [facility]    Report Status PENDING  Incomplete  C difficile quick scan w PCR reflex     Status: Abnormal   Collection Time: 03/19/15  5:29 PM  Result Value Ref Range Status   C Diff antigen POSITIVE (A) NEGATIVE Final   C Diff toxin POSITIVE (A) NEGATIVE Final   C Diff interpretation Positive for toxigenic C. difficile  Final    Comment: CRITICAL RESULT CALLED TO, READ BACK BY AND VERIFIED WITH: Kathyrn Lass RN 2030 03/19/15 A NAVARRO   MRSA PCR Screening     Status: Abnormal   Collection  Time: 03/19/15  5:31 PM  Result Value Ref Range Status   MRSA by PCR POSITIVE (A) NEGATIVE Final    Comment:        The GeneXpert MRSA Assay (FDA approved for NASAL specimens only), is one component of a comprehensive MRSA colonization surveillance program. It is not intended to diagnose MRSA infection nor to guide or monitor treatment for MRSA infections. RESULT CALLED TO, READ BACK BY AND VERIFIED WITH: Bethann Humble RN @ [phone] ON  03/25/2015 BY C DAVIS   Culture, body fluid-bottle     Status: None (Preliminary result)   Collection Time: 03/19/15  9:45 PM  Result Value Ref Range Status   Specimen Description FLUID PLEURAL  Final   Special Requests NONE  Final   Culture   Final    NO GROWTH 2 DAYS Performed at [facility]    Report Status PENDING  Incomplete  Gram stain     Status: None   Collection Time: 03/19/15  9:45 PM  Result Value Ref Range Status   Specimen Description FLUID PLEURAL  Final   Special Requests NONE  Final   Gram Stain   Final    ABUNDANT WBC PRESENT,BOTH PMN AND MONONUCLEAR NO ORGANISMS SEEN Performed at [facility]    Report Status 04/11/2015 FINAL  Final     Scheduled Meds: . antiseptic oral rinse  7 mL Mouth Rinse BID  . Chlorhexidine Gluconate Cloth  6 each Topical Q0600  . mupirocin ointment  1 application Nasal BID  . sodium chloride  3 mL Intravenous Q12H   Continuous Infusions:    Marzetta Board, MD [facility] Pager [phone]. If 7 PM - 7 AM, please contact night-coverage at [email or web address], password [email or web address] 04/13/15, 11:43 AM  LOS: 4 days

## 2015-04-12 NOTE — Discharge Summary (Signed)
   Physician Death Summary  Manuel Barr [id number] DOB: 1945-04-10 DOA: 04/13/2015  PCP: Shiela Mayer, PA  Admit date: 2015/04/13 Death date: 04-17-2015   Discharge Diagnoses:  Principal Problem:   Septic shock ([place]) Active Problems:   Diabetes mellitus, type 2 ([place])   Duodenal cancer ([place])   HTN (hypertension)   HCAP (healthcare-associated pneumonia)   Pulmonary embolism ([place])   Anemia   Protein-calorie malnutrition, severe   Iron deficiency anemia due to chronic blood loss   Leukocytosis   Sepsis (HCC)   Acute kidney injury ([place])   Lactic acidosis   MRSA carrier   Melena   DNR (do not resuscitate)   Palliative care encounter   Dyspnea   Agitation   Filed Weights   03/25/2015 0430 03/21/15 0430 Apr 17, 2015 0416  Weight: 72.5 kg (159 lb 13.3 oz) 74 kg (163 lb 2.3 oz) 74.4 kg (164 lb 0.4 oz)    History of present illness:  See H&P, Labs, Consult and Test reports for all details in brief, patient is a Manuel Barr is a 70 year old male nursing home resident with 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   Hospital Course:  Septic shock ([place]) - on pressors, likely in the setting of HCAP, on Vancomycin and Zosyn, antibiotics d/c 04-16-22 by palliative after [place] discussions. After discussions with family, patient was transitioned to full comfort. Shortly after pressors discontinued patient passed. Acute kidney injury ([place]) - in the setting of shock.  HCAP (healthcare-associated pneumonia) with Empyema Duodenal  cancer (HCC)/Pulmonary embolism (HCC)/Iron deficiency anemia due to chronic blood loss ABLA due to GI bleed - EGD 1/9 with GI bleed from known duodenal tumor, no role for endoscopic intervention per GI Diabetes mellitus, type 2 (HCC)/hypothyroidism Migraine/essential HTN (hypertension) Protein-calorie malnutrition, severe  Signed:  Marzetta Board  [facility] 04-17-15, 1:26 PM

## 2015-04-12 NOTE — Progress Notes (Signed)
   04/07/15 1300  Clinical Encounter Type  Visited With Family  Visit Type Follow-up  Referral From Nurse  Spiritual Encounters  Spiritual Needs Grief support;Emotional;Other (Comment) (pt death)  Stress Factors  Family Stress Factors Loss    Referred by nursing for grief support around pt death.  Provided emotional and spiritual support, grief support and education on ICU and at bedside after pt death.    [place], [place]

## 2015-04-12 NOTE — Progress Notes (Signed)
   04/04/15 1200  Clinical Encounter Type  Visited With Family  Visit Type Follow-up;Psychological support;Spiritual support;Critical Care  Referral From Family  Consult/Referral To Chaplain  Spiritual Encounters  Spiritual Needs Emotional;Other (Comment) (Pastoral Conversation/Support)  Stress Factors  Patient Stress Factors None identified  Family Stress Factors Health changes   I followed up with this patient's family while I was rounding in the ICU.  The patient's mother-in-law and father-in-law were sitting in the doorway. The patient's wife had just left the room. The family appeared appropriately tearful, but understood that the patient was dying.  The mother-in-law was worried about her daughter, the patient's wife, would take it. She stated that the patient's wife appears strong and then cries in private.  They have good support and strong faith.  I will follow-up with the family. Please contact the Chaplain if needed sooner.    [place] M.Div.

## 2015-04-12 NOTE — Consult Note (Signed)
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   Source of sepsis is not clear at this point, but possibly healthcare associated pneumonia-UA/urine culture pending, chest x-ray shows   Patient will be admitted to telemetry for further management including septic workup-CT chest abdomen and pelvis without contrast, blood culture, UA/urine culture.  Continued physical, functional and cognitive decline, family faced with advanced directive decisions and anticipatory care needs.  This NP Manuel Barr reviewed medical records, received report from team, assessed the patient and then meet at the patient's bedside along with his wife, brother and mother/father in law  to discuss diagnosis, prognosis, GOC, EOL wishes disposition and options.  A discussion was had today regarding advanced directives.  Patient had clearly documented a desire for a natural death in the event of terminal illness with no further treatment options  The difference between a aggressive  medical intervention path  and a palliative comfort care path for this patient at this time was had.    Values and goals of care important to patient and family were attempted to be elicited.   Family were able to verbalize their understanding of limited prognosis, and peace with the decision for comfort at this known EOL   Natural trajectory and expectations at EOL were discussed.  Questions and concerns addressed.   Family encouraged to call with questions or concerns.  PMT will continue to support holistically.   Primary Decision Maker: wife   HCPOA: yes/ wife   SUMMARY OF RECOMMENDATIONS  -focus is comfort, quality and dignity -no further life prolonging interventions -expect hospital death  Code Status/Advance Care Planning: DNR    Code Status Orders        Start     Ordered   03/30/2015 1426  Do not attempt resuscitation (DNR)   Continuous    Question Answer Comment  In the event of cardiac or respiratory ARREST Do not call a "code blue"   In the event of cardiac or respiratory ARREST Do not perform Intubation, CPR, defibrillation or ACLS   In the event of cardiac or respiratory ARREST Use medication by any route, position, wound care, and other measures to relive pain and suffering. May use oxygen, suction and manual treatment of airway obstruction as needed for comfort.      03/23/2015 1425    Code Status History    Date Active Date Inactive Code Status Order ID Comments User Context   03/28/2015  6:55 PM 04/10/2015  2:25 PM Full Code [id number]  Nat Math, MD Inpatient   03/05/2015  7:03 PM 03/09/2015  8:56 PM Full Code [id number]  Velvet Bathe, MD Inpatient   11/07/2014  9:09 PM 11/08/2014  8:36 PM Full Code [id number]  Corky Sox, MD Inpatient  Other Directives:Advanced Directive and Living Will  Symptom Management:   Pain/Dyspnea: Morphine  Agitation: Ativan  Palliative Prophylaxis:   Aspiration, Bowel Regimen, Frequent Pain Assessment, Oral Care and Turn  Reposition  Additional Recommendations (Limitations, Scope, Preferences):  Full Comfort Care   Psycho-social/Spiritual:  Support System: Strong  Additional Recommendations: Education on Hospice and Grief/Bereavement Support  Prognosis: Hours - Days  Discharge Planning: Anticipated Hospital Death   Chief Complaint/ Primary Diagnoses: Present on Admission:  . Leukocytosis . Sepsis ([place]) . Acute kidney injury ([place]) . Lactic acidosis . (Resolved) Hyperkalemia . Pulmonary embolism ([place]) . Duodenal cancer ([place]) . Anemia . Protein-calorie malnutrition, severe . Iron deficiency anemia due to chronic blood loss . HTN (hypertension) . HCAP (healthcare-associated pneumonia)  I have reviewed the medical record, interviewed the patient and family, and examined the patient. The following aspects are pertinent.  Past Medical History  Diagnosis Date  . Type II diabetes mellitus ([place])   . Hyperlipemia     Archie Endo 11/07/2014  . Hypertension     Archie Endo 11/07/2014  . Kidney stones   . Cancer of duodenum ([place]) dx'd 08/2014    w/liver mets/notes 11/07/2014  . Liver cancer ([place]) dx'd 08/2014  . Macular degeneration, bilateral   . GERD (gastroesophageal reflux disease)   . Sleep apnea     "suppose to use a mask but I haven't in awhile" (11/07/2014)  . Pulmonary embolism ([place]) 02/2015    DVT/PE s/p IVC filter   . GIB (gastrointestinal bleeding)   . Empyema ([place]) 03/2015   Social History   Social History  . Marital Status: Married    Spouse Name: N/A  . Number of Children: N/A  . Years of Education: N/A   Social History Main Topics  . Smoking status: Never Smoker   . Smokeless tobacco: Never Used  . Alcohol Use: No  . Drug Use: No  . Sexual Activity: Not Currently   Other Topics Concern  . None   Social History Narrative   Family History  Problem Relation Age of Onset  . Diabetes Mother   . Diabetes Father   . Heart disease Father   . Emphysema Father    Scheduled  Meds: . antiseptic oral rinse  7 mL Mouth Rinse BID  . Chlorhexidine Gluconate Cloth  6 each Topical Q0600  . famotidine (PEPCID) IV  20 mg Intravenous Daily  . feeding supplement (GLUCERNA SHAKE)  237 mL Oral TID BM  . insulin aspart  0-9 Units Subcutaneous TID WC  . mupirocin ointment  1 application Nasal BID  . piperacillin-tazobactam  3.375 g Intravenous Q8H  . protein supplement  8 oz Oral BID  . sodium chloride  3 mL Intravenous Q12H  . vancomycin  125 mg Oral QID  . vancomycin  1,000 mg Intravenous Q24H   Continuous Infusions: . norepinephrine (LEVOPHED) Adult infusion 20 mcg/min (04-05-2015 0911)   PRN Meds:.acetaminophen **OR** acetaminophen, alum & mag hydroxide-simeth, fluticasone, lip balm, LORazepam, morphine injection, ondansetron (ZOFRAN) IV, oxyCODONE, sodium chloride Medications Prior to Admission:  Prior to Admission medications   Medication Sig Start Date End Date Taking? Authorizing Provider  cyclobenzaprine (FLEXERIL) 10 MG tablet Take 5 mg by mouth at bedtime as needed for muscle spasms. Muscle spasms   Yes Historical Provider, MD  feeding supplement, GLUCERNA SHAKE, (GLUCERNA SHAKE) LIQD Take 237 mLs by mouth 3 (three) times daily between meals. 03/09/15  Yes Christina P Rama, MD  ferrous sulfate 325 (65 FE) MG tablet Take 1 tablet (325  mg total) by mouth 2 (two) times daily with a meal. 03/09/15  Yes Christina P Rama, MD  fluticasone (FLONASE) 50 MCG/ACT nasal spray Place 1 spray into both nostrils daily as needed for allergies or rhinitis.    Yes Historical Provider, MD  insulin aspart (NOVOLOG) 100 UNIT/ML injection Inject 0-15 Units into the skin 3 (three) times daily with meals. 03/09/15  Yes Christina P Rama, MD  insulin aspart (NOVOLOG) 100 UNIT/ML injection Inject 0-5 Units into the skin at bedtime. 03/09/15  Yes Venetia Maxon Rama, MD  insulin glargine (LANTUS) 100 UNIT/ML injection Inject 0.05 mLs (5 Units total) into the skin at bedtime. 03/09/15  Yes Christina  P Rama, MD  nystatin (MYCOSTATIN) 100000 UNIT/ML suspension Take 6 mLs by mouth 3 (three) times daily. Swish and swallow 6cc   Yes Historical Provider, MD  ondansetron (ZOFRAN) 8 MG tablet Take 8 mg by mouth every 8 (eight) hours as needed for nausea or vomiting.    Yes Historical Provider, MD  oxycodone (OXY-IR) 5 MG capsule Take 1 capsule (5 mg total) by mouth every 4 (four) hours as needed for pain. 03/09/15  Yes Christina P Rama, MD  pantoprazole (PROTONIX) 40 MG tablet Take 40 mg by mouth daily as needed (heartburn).    Yes Historical Provider, MD   Allergies  Allergen Reactions  . Codeine Nausea And Vomiting  . Other     Pt is on a Mechanical Soft Diet, has a stint in stomach.    Review of Systems  Unable to perform ROS   Physical Exam  Constitutional: He appears cachectic. He appears ill.  Cardiovascular: Tachycardia present.   Respiratory: He has rhonchi.  Musculoskeletal:       Right shoulder: He exhibits decreased strength.  Neurological: He is unresponsive.  Skin: Skin is warm and dry.  Mottling at knees    Vital Signs: BP 85/50 mmHg  Pulse 104  Temp(Src) 98.4 F (36.9 C) (Axillary)  Resp 17  Ht 5\' 2"  (1.575 m)  Wt 74.4 kg (164 lb 0.4 oz)  BMI 29.99 kg/m2  SpO2 94%  SpO2: SpO2: 94 % O2 Device:SpO2: 94 % O2 Flow Rate: .O2 Flow Rate (L/min): 5 L/min  IO: Intake/output summary:  Intake/Output Summary (Last 24 hours) at 04-Apr-2015 1010 Last data filed at 04-Apr-2015 0800  Gross per 24 hour  Intake 1366.17 ml  Output    575 ml  Net 791.17 ml    LBM: Last BM Date: 04/04/15 Baseline Weight: Weight: 66.6 kg (146 lb 13.2 oz) Most recent weight: Weight: 74.4 kg (164 lb 0.4 oz)      Palliative Assessment/Data:  Flowsheet Rows        Most Recent Value   Intake Tab    Referral Department  Critical care   Unit at Time of Referral  ICU   Palliative Care Primary Diagnosis  Cancer   Date Notified  03/28/2015   Palliative Care Type  New Palliative care   Reason for  referral  Clarify Goals of Care   Date of Admission  04/10/2015   # of days IP prior to Palliative referral  2   Clinical Assessment    Psychosocial & Spiritual Assessment    Palliative Care Outcomes       Additional Data Reviewed:  CBC:    Component Value Date/Time   WBC 36.6* 2015-04-04 0410   WBC 9.8 10/26/2012 1250   HGB 11.7* 04-04-2015 0410   HGB 14.4 10/26/2012 1250   HCT 37.4* 2015/04/04  0410   HCT 44.7 10/26/2012 1250   PLT 44* 2015/04/17 0410   MCV 94.4 04-17-15 0410   MCV 91.8 10/26/2012 1250   NEUTROABS 23.5* 04/06/2015 0430   LYMPHSABS 1.3 03/25/2015 0430   MONOABS 0.8 03/28/2015 0430   EOSABS 0.3 03/29/2015 0430   BASOSABS 0.0 03/18/2015 0430   Comprehensive Metabolic Panel:    Component Value Date/Time   NA 135 04/17/15 0410   K 4.8 2015-04-17 0410   CL 105 04/17/2015 0410   CO2 17* 04-17-15 0410   BUN 59* 17-Apr-2015 0410   CREATININE 2.51* 17-Apr-2015 0410   CREATININE 0.98 10/26/2012 1259   GLUCOSE 250* 2015/04/17 0410   CALCIUM 7.4* Apr 17, 2015 0410   AST 52* 2015-04-17 0410   ALT 40 2015-04-17 0410   ALKPHOS 685* 04-17-15 0410   BILITOT 4.8* 04/17/2015 0410   PROT 4.3* 04-17-15 0410   ALBUMIN 1.6* 04-17-15 0410     Time In: 0915 Time Out: 1030 Time Total: 75 min Greater than 50%  of this time was spent counseling and coordinating care related to the above assessment and plan.  Signed by: Manuel Lessen, NP  Knox Royalty, NP  04-17-15, 10:10 AM  Please contact Palliative Medicine Team phone at [phone] for questions and concerns.

## 2015-04-12 NOTE — Consult Note (Signed)
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 Pleurisy  : Patient not able to give any history.  HPI: Manuel Barr is well-known to me. He is a 70 year old gentleman. He has been cared for outside of the [facility] health care system. He has been seen at the [facility] and a [facility]. He recently was hospitalized at [facility] with progression of his cancer and hypercoagulability and bleeding. He is not a candidate for anticoagulation secondary to bleeding. He has a filter in place.  He was discharged and readmitted just a couple days at a been discharged. He, in my mind, has progression of his cancer. We saw him back in December, his prealbumin was only 3.3. This I felt was the "driver" for his prognosis. I told him that I do not think he was going to make it through January.  So far, cultures have been all negative. Scans show that he has progression of his disease.  He has renal insufficiency that is worsening.  He has progressive thrombocytopenia.  He's not able to give me any history this morning. I don't think is really eating much.  Overall, his performance status is ECOG 4.   Past Medical History  Diagnosis Date  . Type II diabetes mellitus ([place])   . Hyperlipemia     [facility] 11/07/2014  . Hypertension     [facility] 11/07/2014  . Kidney stones   . 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 Liver cancer ([place]) dx'd 08/2014  . Macular degeneration, bilateral   . GERD (gastroesophageal reflux disease)   . Sleep apnea     "suppose to use a mask but I haven't in awhile" (11/07/2014)  . Pulmonary embolism ([place]) 02/2015    DVT/PE s/p IVC filter   . GIB (gastrointestinal bleeding)   . Empyema ([place]) 03/2015  :  Past Surgical History  Procedure Laterality Date  . Abdominal exploration surgery  08/2014    attempted whipple per wife -but  unable to resect /notes 11/07/2014  . Bile duct stent placement  09/2014    [facility] 11/07/2014  . Cholecystectomy open  08/2014  . Esophagogastroduodenoscopy N/A 04/04/2015    Procedure: ESOPHAGOGASTRODUODENOSCOPY (EGD);  Surgeon: Jerene Bears, MD;  Location: [facility];  Service: Endoscopy;  Laterality: N/A;  at bedside  :   Current facility-administered medications:  .  acetaminophen (TYLENOL) tablet 650 mg, 650 mg, Oral, Q6H PRN **OR** acetaminophen (TYLENOL) suppository 650 mg, 650 mg, Rectal, Q6H PRN, Simbiso Ranga, MD .  alum & mag hydroxide-simeth (MAALOX/MYLANTA) 200-200-20 MG/5ML suspension 30 mL, 30 mL, Oral, Q6H PRN, Simbiso Ranga, MD .  antiseptic oral rinse (CPC / CETYLPYRIDINIUM CHLORIDE 0.05%) solution 7 mL, 7 mL, Mouth Rinse, BID, Simbiso Ranga, MD, 7 mL at 03/21/15 2200 .  Chlorhexidine Gluconate Cloth 2 % PADS 6 each, 6 each, Topical, Q0600, Simbiso Ranga, MD, 6 each at 04/06/2015 1816 .  famotidine (PEPCID) IVPB 20 mg premix, 20 mg, Intravenous, Daily, Donita Brooks, NP, 20 mg at 03/21/15 0806 .  feeding supplement (GLUCERNA SHAKE) (GLUCERNA SHAKE) liquid 237 mL, 237 mL, Oral, TID BM, Simbiso Ranga, MD, 237 mL at 03/19/2015 0944 .  ferrous sulfate tablet 325 mg, 325 mg, Oral, BID WC, Simbiso Ranga, MD, 325 mg at 03/21/15 0800 .  fluticasone (FLONASE) 50 MCG/ACT nasal spray 1 spray, 1 spray, Each Nare, Daily PRN,  Simbiso Ranga, MD .  insulin aspart (novoLOG) injection 0-9 Units, 0-9 Units, Subcutaneous, TID WC, Simbiso Ranga, MD, 1 Units at 03/19/15 0859 .  lip balm (CARMEX) ointment, , Topical, PRN, Simbiso Ranga, MD .  LORazepam (ATIVAN) injection 0.5 mg, 0.5 mg, Intravenous, Q4H PRN, Knox Royalty, NP, 0.5 mg at 03/21/15 2337 .  morphine 2 MG/ML injection 1-2 mg, 1-2 mg, Intravenous, Q2H PRN, Kara Mead V, MD, 2 mg at 2015-04-06 0531 .  mupirocin ointment (BACTROBAN) 2 % 1 application, 1 application, Nasal, BID, Simbiso Ranga, MD, 1 application at [id number] .  norepinephrine  (LEVOPHED) 4 mg in dextrose 5 % 250 mL (0.016 mg/mL) infusion, 0-20 mcg/min, Intravenous, Titrated, Kara Mead V, MD, Last Rate: 71.3 mL/hr at 04-06-2015 0525, 19 mcg/min at 2015/04/06 0525 .  ondansetron (ZOFRAN) injection 4 mg, 4 mg, Intravenous, Q6H PRN, Simbiso Ranga, MD .  oxyCODONE (Oxy IR/ROXICODONE) immediate release tablet 5 mg, 5 mg, Oral, Q4H PRN, Simbiso Ranga, MD, 5 mg at 03/21/15 1157 .  piperacillin-tazobactam (ZOSYN) IVPB 3.375 g, 3.375 g, Intravenous, Q8H, Simbiso Ranga, MD, 3.375 g at 04/06/2015 0127 .  protein supplement (UNJURY VANILLA) powder 8 oz, 8 oz, Oral, BID, Clayton Bibles, RD, 8 oz at 03/21/2015 2257 .  sodium chloride 0.9 % injection 10-40 mL, 10-40 mL, Intracatheter, PRN, Simbiso Ranga, MD .  sodium chloride 0.9 % injection 3 mL, 3 mL, Intravenous, Q12H, Simbiso Ranga, MD, 3 mL at 03/21/15 2106 .  vancomycin (VANCOCIN) 50 mg/mL oral solution 125 mg, 125 mg, Oral, QID, Rhetta Mura Schorr, NP, 125 mg at 03/21/15 1113 .  vancomycin (VANCOCIN) IVPB 1000 mg/200 mL premix, 1,000 mg, Intravenous, Q24H, Audrea Muscat Frens, RPH, 1,000 mg at 03/21/15 1723:  . antiseptic oral rinse  7 mL Mouth Rinse BID  . Chlorhexidine Gluconate Cloth  6 each Topical Q0600  . famotidine (PEPCID) IV  20 mg Intravenous Daily  . feeding supplement (GLUCERNA SHAKE)  237 mL Oral TID BM  . ferrous sulfate  325 mg Oral BID WC  . insulin aspart  0-9 Units Subcutaneous TID WC  . mupirocin ointment  1 application Nasal BID  . piperacillin-tazobactam  3.375 g Intravenous Q8H  . protein supplement  8 oz Oral BID  . sodium chloride  3 mL Intravenous Q12H  . vancomycin  125 mg Oral QID  . vancomycin  1,000 mg Intravenous Q24H  :  Allergies  Allergen Reactions  . Codeine Nausea And Vomiting  . Other     Pt is on a Mechanical Soft Diet, has a stint in stomach.  :  Family History  Problem Relation Age of Onset  . Diabetes Mother   . Diabetes Father   . Heart disease Father   . Emphysema Father    :  Social History   Social History  . Marital Status: Married    Spouse Name: N/A  . Number of Children: N/A  . Years of Education: N/A   Occupational History  . Not on file.   Social History Main Topics  . Smoking status: Never Smoker   . Smokeless tobacco: Never Used  . Alcohol Use: No  . Drug Use: No  . Sexual Activity: Not Currently   Other Topics Concern  . Not on file   Social History Narrative  :  Pertinent items are noted in HPI.  Exam: Patient Vitals for the past 24 hrs:  BP Temp Temp src Pulse Resp SpO2 Weight  04/06/15 0540 (!) 96/53 mmHg - - -  16 - -  04/08/2015 0530 (!) 91/53 mmHg - - (!) 112 17 97 % -  04/08/2015 0500 (!) 91/50 mmHg - - (!) 107 18 94 % -  04/08/15 0416 - - - - - - 164 lb 0.4 oz (74.4 kg)  04-08-2015 0400 (!) 94/54 mmHg 97.8 F (36.6 C) Axillary (!) 109 18 96 % -  2015-04-08 0300 99/61 mmHg - - (!) 109 19 97 % -  04-08-2015 0230 99/64 mmHg - - - (!) 22 - -  04/08/15 0200 99/61 mmHg - - - 20 - -  04/08/15 0145 (!) 88/60 mmHg - - - (!) 23 - -  2015/04/08 0140 95/64 mmHg - - - (!) 26 - -  2015/04/08 0134 (!) 88/62 mmHg - - - (!) 22 - -  04-08-2015 0100 103/61 mmHg - - - (!) 21 - -  04/08/2015 0000 (!) 93/53 mmHg 98 F (36.7 C) Axillary - 17 - -  03/21/15 2300 106/68 mmHg - - (!) 115 20 96 % -  03/21/15 2200 107/60 mmHg - - - (!) 26 - -  03/21/15 2157 102/71 mmHg - - - 14 - -  03/21/15 2100 (!) 89/56 mmHg - - - 13 - -  03/21/15 2024 - 98.4 F (36.9 C) Oral - - - -  03/21/15 2000 96/64 mmHg - - - 13 - -  03/21/15 1900 97/60 mmHg - - - 13 - -  03/21/15 1830 (!) 87/59 mmHg - - (!) 109 (!) 21 99 % -  03/21/15 1700 (!) 87/61 mmHg - - (!) 111 15 98 % -  03/21/15 1600 - 98 F (36.7 C) Oral - - - -  03/21/15 1500 (!) 89/62 mmHg - - (!) 109 14 100 % -  03/21/15 1425 95/61 mmHg - - - 15 100 % -  03/21/15 1417 - - - (!) 115 17 98 % -  03/21/15 1410 - - - (!) 112 16 (!) 85 % -  03/21/15 1400 (!) 83/54 mmHg - - (!) 110 14 (!) 82 % -  03/21/15 1351 (!) 78/56  mmHg - - (!) 112 20 92 % -  03/21/15 1348 (!) 67/47 mmHg - - (!) 113 19 92 % -  03/21/15 1300 (!) 87/57 mmHg - - (!) 116 (!) 25 90 % -  03/21/15 1217 (!) 84/57 mmHg - - (!) 118 (!) 24 (!) 88 % -  03/21/15 1200 96/63 mmHg - - (!) 117 (!) 22 91 % -  03/21/15 1142 - 97.4 F (36.3 C) - - - - -  03/21/15 1100 99/67 mmHg - - (!) 108 12 94 % -  03/21/15 1000 108/67 mmHg - - (!) 109 12 96 % -  03/21/15 0900 93/66 mmHg - - (!) 111 13 95 % -  03/21/15 0800 102/75 mmHg 99.3 F (37.4 C) Oral (!) 117 (!) 21 97 % -  03/21/15 0730 (!) 63/47 mmHg - - (!) 117 15 90 % -    as above    Recent Labs  03/21/15 0405 Apr 08, 2015 0410  WBC 18.2* 36.6*  HGB 11.7* 11.7*  HCT 36.7* 37.4*  PLT 67* 44*    Recent Labs  04/11/2015 0430 2015/04/08 0410  NA 138 135  K 4.2 4.8  CL 106 105  CO2 22 17*  GLUCOSE 93 250*  BUN 58* 59*  CREATININE 2.13* 2.51*  CALCIUM 7.3* 7.4*    Blood smear review:  None  Pathology: None  Assessment and Plan:  Mr. 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 He has extensive disease in his left lung and has a Pleurx catheter and that is opened so that fluid can drain.  His white cell count is going up. His platelet count is going down.  I did look at his blood smear. 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  His bilirubin is climbing. This probably reflects both hepatic involvement and hemolysis.  I really think that his prognosis is less than one week at this point. Again, the prealbumin of 3.3 we saw him in December I thought was really the determine of his prognosis. Now that he has microangiopathic hemolysis of malignancy, this is another "stress" on his body that he just will not be able to manage.  Again, I really think that his prognosis is less than a week. He is DO NOT RESUSCITATE which is very appropriate.  He is not bleeding. I would not transfuse him as  this would not help and could actually exacerbate the hemolysis and thrombocytopenia.  I do feel that for him. He is nice. He does has a very aggressive malignancy that will "take him" by the weekend in my opinion.  I appreciate the outstanding help that he is getting from the staff in the ICU.  Manuel Barr  2 Timothy 4:17-18

## 2015-04-12 NOTE — Progress Notes (Signed)
Date: April 01, 2015 Chart reviewed for concurrent status and case management needs. Will continue to follow patient for changes and needs: hypotensive on [id number], moved down from floor to icu started on iv levophed drip to maintain pressure, o2 at [place], [place], [place], [place]

## 2015-04-12 DEATH — deceased

## 2015-09-03 DIAGNOSIS — Z95828 Presence of other vascular implants and grafts: Secondary | ICD-10-CM | POA: Insufficient documentation

## 2017-12-25 IMAGING — CT CT CHEST W/O CM
2 of 4 series · 15 of 46 positions shown, 17 images · non-contrast
Comparison: None.

CLINICAL DATA: Patient was admitted for sepsis, also has hx of
liver [REDACTED], GERD, kidney stones, HTN, diabetic, cancer of
timePatient is from [facility] with a complaint of
chronic "lung pain". Patient has stage 4 lung cancer and was
medicated with oxycodone 5mg at the facility and the patient was not
satisfied with the result. However he rates his pain as a 3. Patient
has multiple complaints of dehydration, back pain, and blurred
vision due to dehydration.

EXAM:
CT CHEST, ABDOMEN AND PELVIS WITHOUT CONTRAST
TECHNIQUE: Multidetector CT imaging of the chest, abdomen and pelvis was
performed following the standard protocol without IV contrast.

[Series 2: cap w/o w/o st · axial · non-contrast · 0.98mm/px · z∈[-652,-86]mm · 12 of 127 slices shown, 14 images]
[im 7/127  soft-tissue]
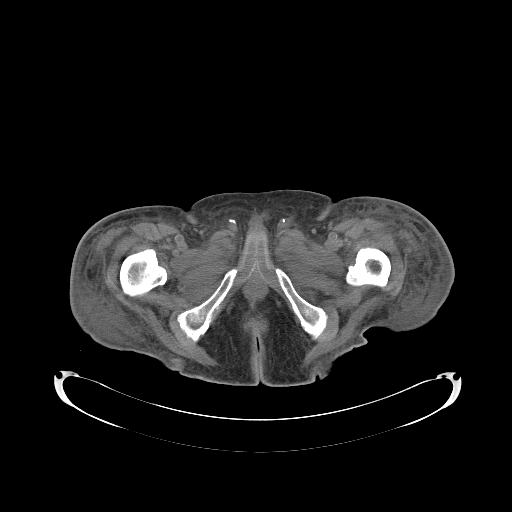
[im 7/127  bone]
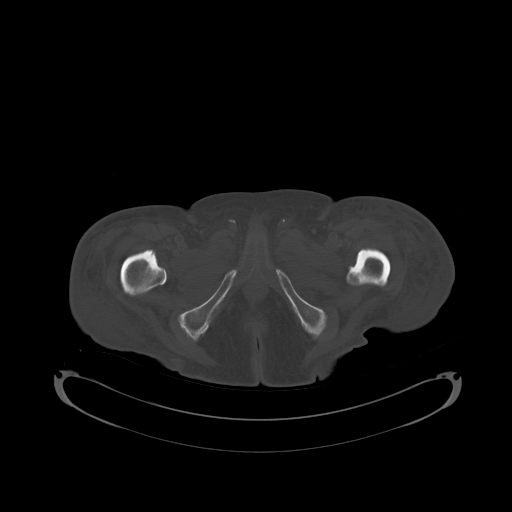
[im 19/127  soft-tissue]
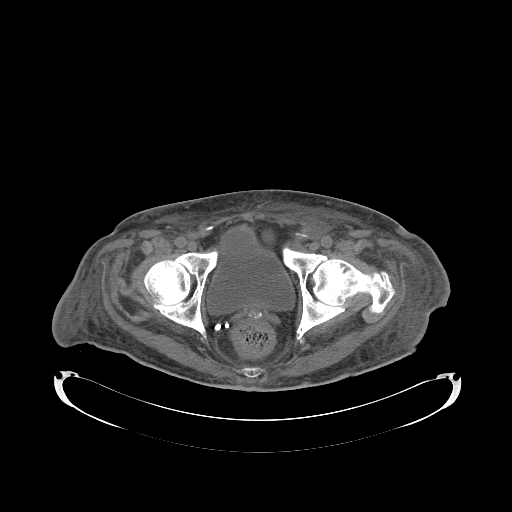
[im 26/127  soft-tissue]
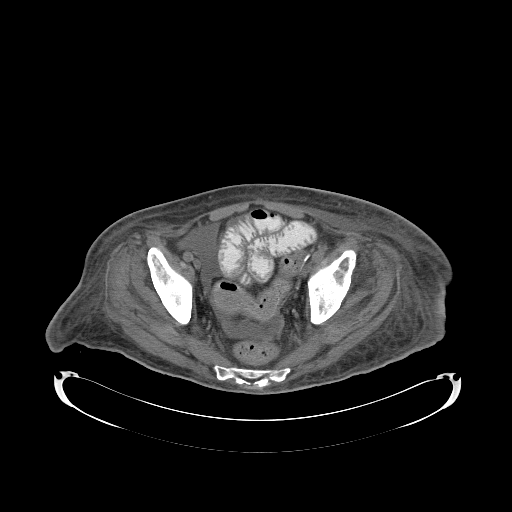
[im 38/127  soft-tissue]
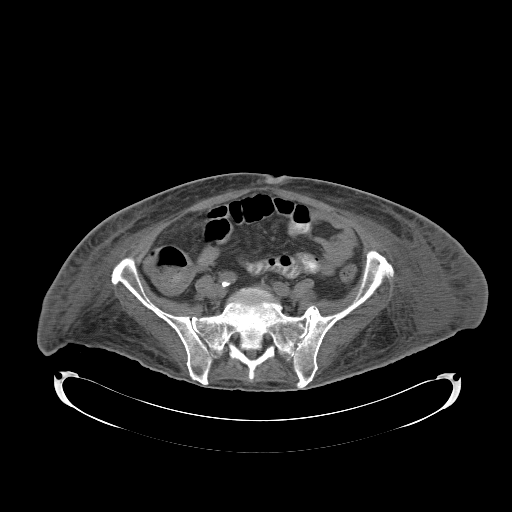
[im 51/127  soft-tissue]
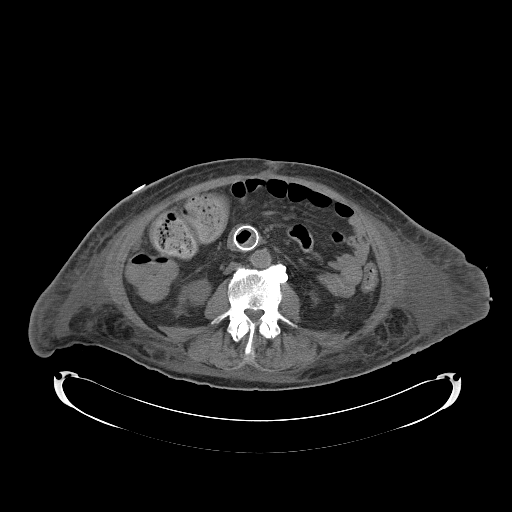
[im 57/127  soft-tissue]
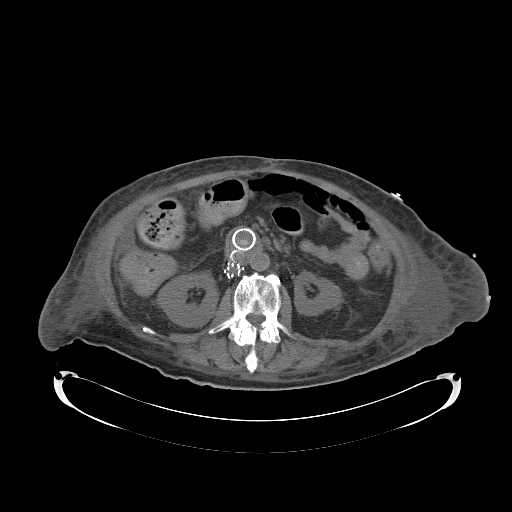
[im 70/127  soft-tissue]
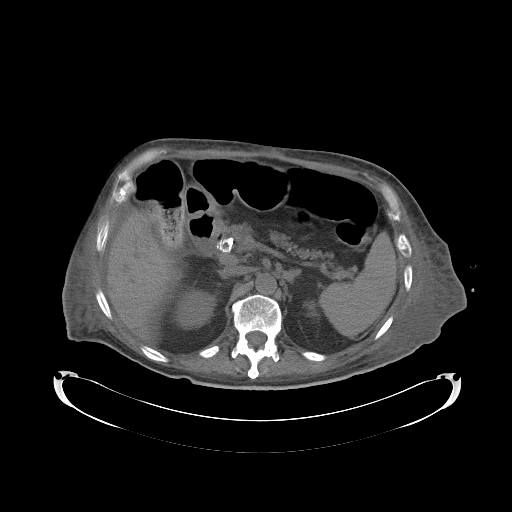
[im 76/127  soft-tissue]
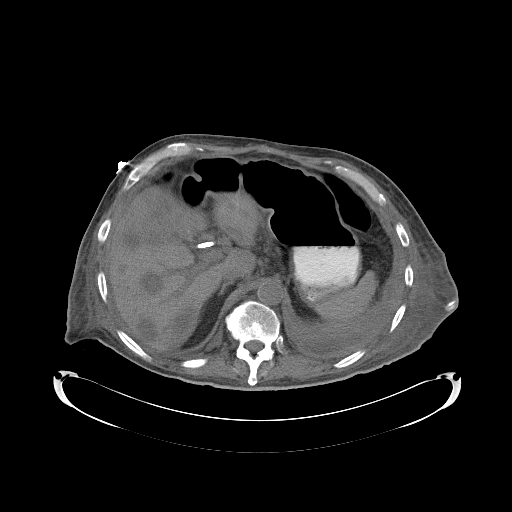
[im 89/127  soft-tissue]
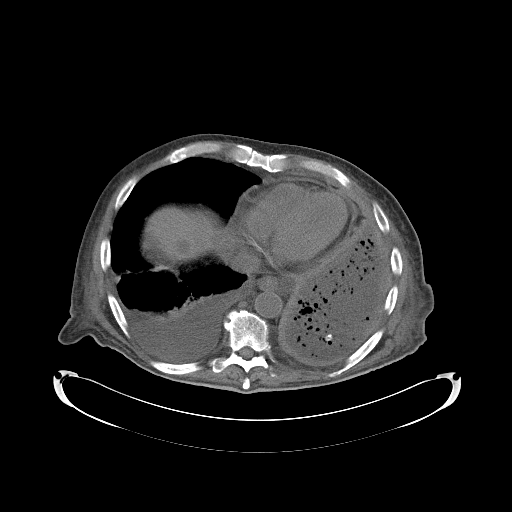
[im 89/127  bone]
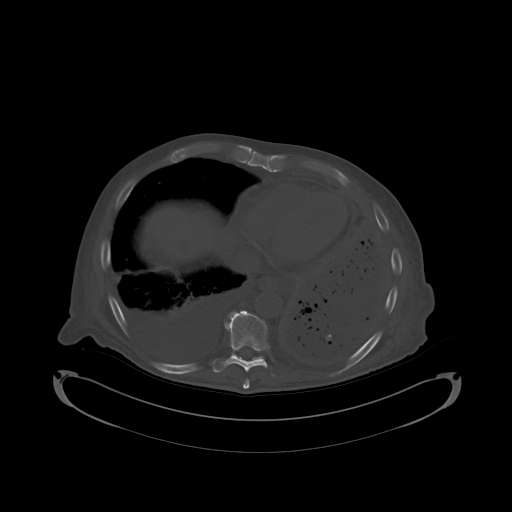
[im 101/127  soft-tissue]
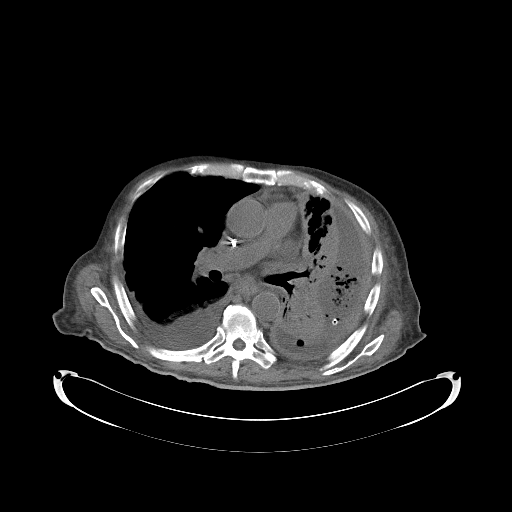
[im 108/127  soft-tissue]
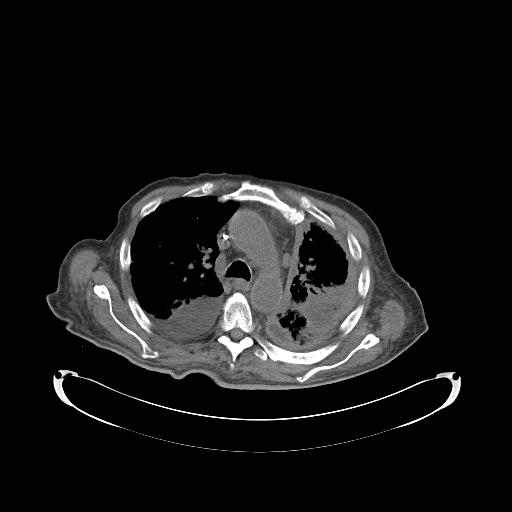
[im 120/127  soft-tissue]
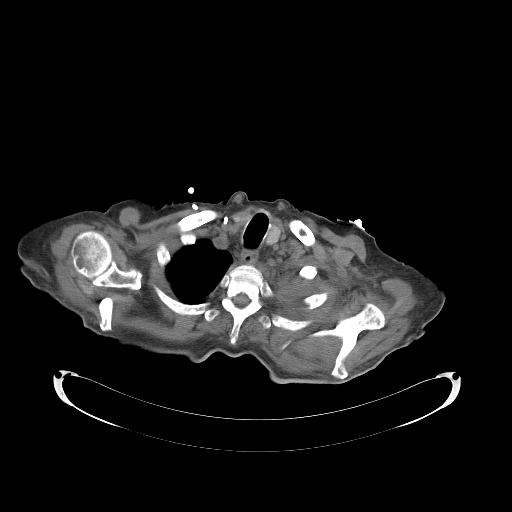

[Series 602: <mpr thick range> · coronal · 1.24mm/px · 3 of 131 slices shown]
[im 44/131  soft-tissue]
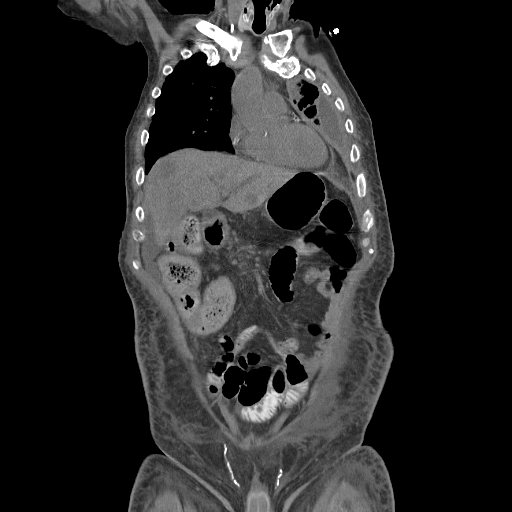
[im 58/131  soft-tissue]
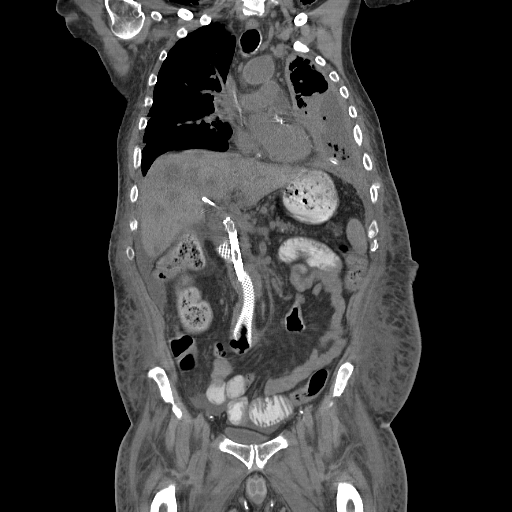
[im 73/131  soft-tissue]
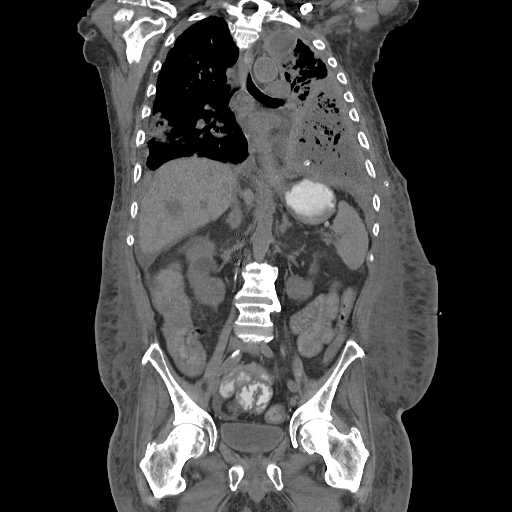

[15 of 46 positions shown; findings below may reference images not displayed]

FINDINGS: CT CHEST

Neck base and axilla: Mildly enlarged left neck base lymph nodes,
largest measuring 13 mm in short axis. No axillary masses or
adenopathy.

Mediastinum and hila: Heart normal in size configuration. There are
dense coronary artery calcifications. Ascending aorta is prominent
measuring 3.8 x 3.6 cm. Multiple sub cm mediastinal lymph nodes. No
pathologically enlarged lymph nodes.

Lungs and pleura: There is complex fluid containing bubbles of air
mid extends from the left apex along the left lateral and posterior
hemi thorax the left lung base. A chest tube lies within this
collection. This may all be pleural or reflect combination of lung
necrosis and empyema air. The underlying left lung is mostly
opacified. Aerated portions show irregular interstitial thickening
hazy areas of airspace opacity. On the right common there is
irregular interstitial thickening with ill-defined small nodules,
largest in the left upper lobe, image 23, series 4 measuring 6 mm.
Associated with a moderate pleural effusion.

CT ABDOMEN AND PELVIS

Liver: There are multiple ill-defined low-density masses consistent
widespread metastatic disease. Hit in the posterior segment of the
right lobe, there is an ill-defined lesion that measures
approximately 3.9 x 1.9 cm transversely. In the central liver
between the medial segment of the left lobe and anterior segment of
the right lobe there is a 2.9 cm lesion.

Spleen:  Unremarkable.

Gallbladder, biliary tree and pancreas: Gallbladder surgically
absent. There is a biliary stent. Another stent is seen within the
duodenum prominent and mildly enlarged lymph nodes are noted along
the gastrohepatic ligament, largest measuring 12 mm in short axis.
There are prominent peripancreatic lymph nodes. No convincing
pancreatic mass or inflammation.

Adrenal glands:  No adrenal masses.

Kidneys, ureters, bladder: 4 cm midpole right renal low-attenuation
mass consistent with a cyst. Left renal cortical thinning. No renal
stones. No hydronephrosis. Ureters are normal course and caliber.
Bladder is unremarkable.

Ascites: Small amount of ascites collects adjacent to the liver on
and in the pelvis.

Gastrointestinal: Mild distention of the right colon and transverse
colon with air and stool. No bowel obstruction. No bowel wall
thickening or inflammatory changes. Stent extends throughout the
second portion duodenum.

Surrounding soft tissues. There is diffuse soft tissue edema that is
most prominent along the flanks.

MUSCULOSKELETAL

No osteoblastic or osteolytic lesions.
IMPRESSION: 1. Complex fluid collection in the left hemi thorax consistent with
an empyema. There is likely associated necrotic pneumonia.
Additional left lung consolidation is likely combination of
infection and atelectasis.
2. Moderate right pleural effusion.
3. Irregular areas of interstitial thickening with ill-defined
nodules, largest in the right upper lobe measuring 6 mm. This is
concerning for metastatic disease. Left neck base adenopathy also
concerning metastatic disease although this could be reactive.
4. Numerous low-density liver lesions consistent with widespread
liver metastatic disease. This is likely from the patient's reported
duodenal carcinoma. Patient has a stent the spans the second portion
the duodenum, as well is a biliary stent. There is presumed
metastatic peripancreatic and gastrohepatic ligament adenopathy.
5. There is also small amount ascites and diffuse soft tissue edema
consistent with anasarca.

## 2017-12-27 IMAGING — DX DG CHEST 1V PORT
1 series · 1 of 1 positions shown · non-contrast
Comparison: CT 03/18/2015.

CLINICAL DATA: History of empyema.

EXAM:
PORTABLE CHEST 1 VIEW

[chest ap]
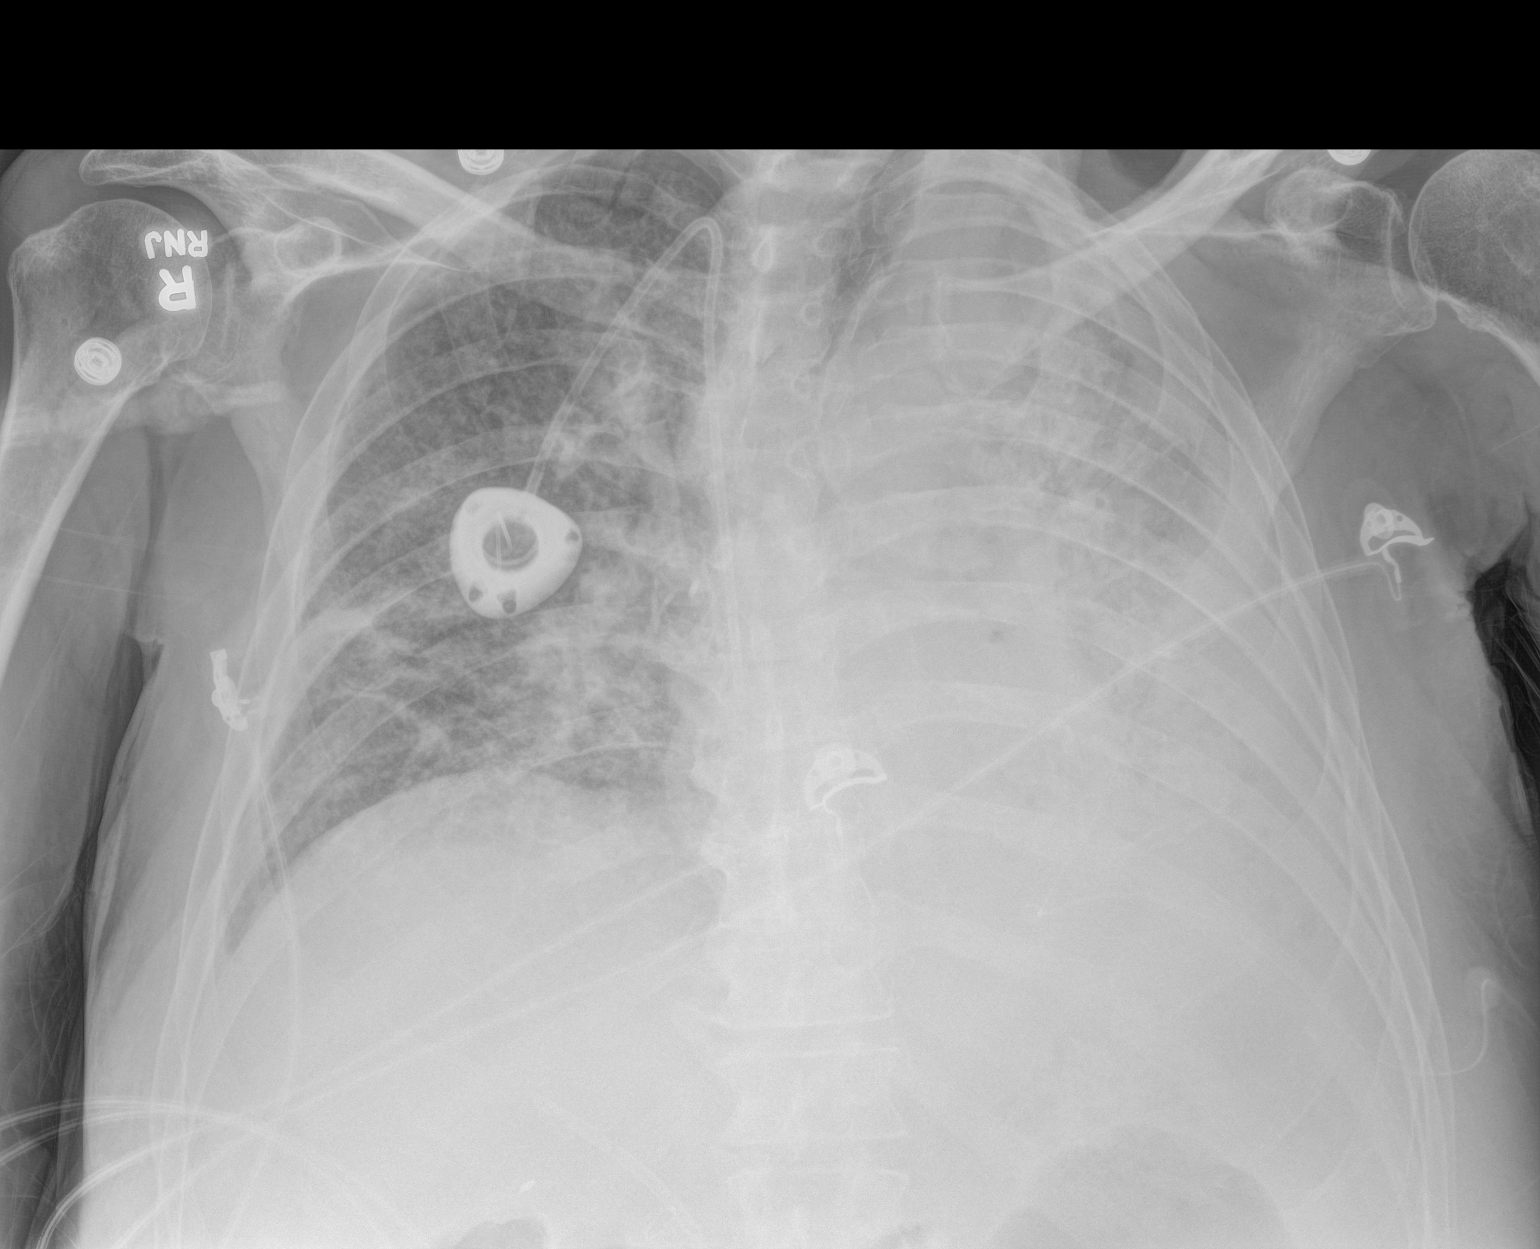

[1 of 1 positions shown; findings below may reference images not displayed]

FINDINGS: PowerPort catheter noted with lead tip in the right atrium. Left
chest tube in stable position. Left lung diffuse infiltrate and left
pleural fluid collection again noted. Diffuse interstitial
prominence right lung. This may be related pneumonitis however
component congestive heart failure cannot be excluded. Right base
atelectasis. No pneumothorax . No acute bony abnormality
IMPRESSION: 1. Power port catheter left chest tube in stable position.
2. Persistent diffuse left lung infiltrate and/or pleural fluid
collection/empyema.
3. Increased interstitial markings noted throughout the right lung.
These changes may be related to pneumonitis. Congestive heart
failure cannot be excluded . Right base subsegmental atelectasis .
